# Patient Record
Sex: Male | Born: 1990 | Race: Black or African American | Hispanic: No | Marital: Single | Smoking: Current some day smoker
Health system: Southern US, Community
[De-identification: ages and names within clinical notes are randomized; demographics above are authoritative.]

## PROBLEM LIST (undated history)

## (undated) DIAGNOSIS — M419 Scoliosis, unspecified: Secondary | ICD-10-CM

---
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
## 2010-05-20 ENCOUNTER — Emergency Department (HOSPITAL_COMMUNITY): Admission: EM | Admit: 2010-05-20 | Discharge: 2010-05-20 | Payer: Self-pay | Admitting: Emergency Medicine

## 2011-01-27 ENCOUNTER — Emergency Department (HOSPITAL_COMMUNITY): Payer: Self-pay

## 2011-01-27 ENCOUNTER — Encounter (HOSPITAL_COMMUNITY): Payer: Self-pay | Admitting: Radiology

## 2011-01-27 ENCOUNTER — Emergency Department (HOSPITAL_COMMUNITY)
Admission: EM | Admit: 2011-01-27 | Discharge: 2011-01-27 | Disposition: A | Discharge status: Home / Self Care | Payer: Self-pay | Attending: Emergency Medicine | Admitting: Emergency Medicine

## 2011-01-27 DIAGNOSIS — S0993XA Unspecified injury of face, initial encounter: Secondary | ICD-10-CM | POA: Insufficient documentation

## 2011-01-27 DIAGNOSIS — W11XXXA Fall on and from ladder, initial encounter: Secondary | ICD-10-CM | POA: Insufficient documentation

## 2011-01-27 DIAGNOSIS — Y92009 Unspecified place in unspecified non-institutional (private) residence as the place of occurrence of the external cause: Secondary | ICD-10-CM | POA: Insufficient documentation

## 2011-01-27 DIAGNOSIS — M542 Cervicalgia: Secondary | ICD-10-CM | POA: Insufficient documentation

## 2011-01-27 DIAGNOSIS — S20229A Contusion of unspecified back wall of thorax, initial encounter: Secondary | ICD-10-CM | POA: Insufficient documentation

## 2011-01-27 DIAGNOSIS — M546 Pain in thoracic spine: Secondary | ICD-10-CM | POA: Insufficient documentation

## 2016-01-03 ENCOUNTER — Encounter (HOSPITAL_COMMUNITY): Payer: Self-pay | Admitting: Nurse Practitioner

## 2016-01-03 ENCOUNTER — Emergency Department (HOSPITAL_COMMUNITY): Payer: Self-pay

## 2016-01-03 ENCOUNTER — Emergency Department (HOSPITAL_COMMUNITY)
Admission: EM | Admit: 2016-01-03 | Discharge: 2016-01-03 | Disposition: A | Discharge status: Home / Self Care | Payer: Self-pay | Attending: Emergency Medicine | Admitting: Emergency Medicine

## 2016-01-03 DIAGNOSIS — R630 Anorexia: Secondary | ICD-10-CM | POA: Insufficient documentation

## 2016-01-03 DIAGNOSIS — R197 Diarrhea, unspecified: Secondary | ICD-10-CM | POA: Insufficient documentation

## 2016-01-03 DIAGNOSIS — N390 Urinary tract infection, site not specified: Secondary | ICD-10-CM | POA: Insufficient documentation

## 2016-01-03 DIAGNOSIS — R112 Nausea with vomiting, unspecified: Secondary | ICD-10-CM | POA: Insufficient documentation

## 2016-01-03 DIAGNOSIS — R05 Cough: Secondary | ICD-10-CM | POA: Insufficient documentation

## 2016-01-03 DIAGNOSIS — R079 Chest pain, unspecified: Secondary | ICD-10-CM | POA: Insufficient documentation

## 2016-01-03 DIAGNOSIS — F17210 Nicotine dependence, cigarettes, uncomplicated: Secondary | ICD-10-CM | POA: Insufficient documentation

## 2016-01-03 LAB — CBC
HCT: 45.6 % (ref 39.0–52.0)
Hemoglobin: 15.4 g/dL (ref 13.0–17.0)
MCH: 28.9 pg (ref 26.0–34.0)
MCHC: 33.8 g/dL (ref 30.0–36.0)
MCV: 85.6 fL (ref 78.0–100.0)
Platelets: 260 10*3/uL (ref 150–400)
RBC: 5.33 MIL/uL (ref 4.22–5.81)
RDW: 12.9 % (ref 11.5–15.5)
WBC: 6.5 10*3/uL (ref 4.0–10.5)

## 2016-01-03 LAB — COMPREHENSIVE METABOLIC PANEL
ALT: 19 U/L (ref 17–63)
AST: 22 U/L (ref 15–41)
Albumin: 3.5 g/dL (ref 3.5–5.0)
Alkaline Phosphatase: 57 U/L (ref 38–126)
Anion gap: 9 (ref 5–15)
BUN: 9 mg/dL (ref 6–20)
CO2: 26 mmol/L (ref 22–32)
Calcium: 9 mg/dL (ref 8.9–10.3)
Chloride: 104 mmol/L (ref 101–111)
Creatinine, Ser: 0.96 mg/dL (ref 0.61–1.24)
GFR calc Af Amer: >60 mL/min (ref >60)
GFR calc non Af Amer: >60 mL/min (ref >60)
Glucose, Bld: 98 mg/dL (ref 65–99)
Potassium: 3.8 mmol/L (ref 3.5–5.1)
Sodium: 139 mmol/L (ref 135–145)
Total Bilirubin: 0.9 mg/dL (ref 0.3–1.2)
Total Protein: 6.1 g/dL — ABNORMAL LOW (ref 6.5–8.1)

## 2016-01-03 LAB — URINE MICROSCOPIC-ADD ON

## 2016-01-03 LAB — URINALYSIS, ROUTINE W REFLEX MICROSCOPIC
Glucose, UA: NEGATIVE mg/dL
Hgb urine dipstick: NEGATIVE
Ketones, ur: 40 mg/dL — AB
Nitrite: NEGATIVE
Protein, ur: 30 mg/dL — AB
Specific Gravity, Urine: 1.03 (ref 1.005–1.030)
pH: 8 (ref 5.0–8.0)

## 2016-01-03 LAB — I-STAT TROPONIN, ED: Troponin i, poc: 0 ng/mL (ref 0.00–0.08)

## 2016-01-03 MED ORDER — SULFAMETHOXAZOLE-TRIMETHOPRIM 800-160 MG PO TABS: 1.0000 | ORAL_TABLET | Freq: Two times a day (BID) | ORAL | Status: AC

## 2016-01-03 MED ORDER — ONDANSETRON 4 MG PO TBDP: 4.0000 mg | ORAL_TABLET | Freq: Three times a day (TID) | ORAL | Status: DC | PRN

## 2016-01-03 MED ORDER — GI COCKTAIL ~~LOC~~
30.0000 mL | Freq: Once | ORAL | Status: AC
  Administered 2016-01-03: 30 mL via ORAL
  Filled 2016-01-03: qty 30

## 2016-01-03 MED ORDER — SODIUM CHLORIDE 0.9 % IV BOLUS (SEPSIS)
1000.0000 mL | Freq: Once | INTRAVENOUS | Status: AC
  Administered 2016-01-03: 1000 mL via INTRAVENOUS

## 2016-01-03 MED ORDER — ONDANSETRON HCL 4 MG/2ML IJ SOLN
4.0000 mg | Freq: Once | INTRAMUSCULAR | Status: AC
  Administered 2016-01-03: 4 mg via INTRAVENOUS
  Filled 2016-01-03: qty 2

## 2016-01-03 NOTE — ED Notes (Signed)
He c/o 3 day history n/v/d, sweats, abd pain, headaches, loss of appetite, unable to tolerate any oral intake. hes A&Ox4, breathing easily

## 2016-01-03 NOTE — ED Provider Notes (Signed)
CSN: [id number]     Arrival date & time 01/03/16  1541 History  By signing my name below, I, Placido SouLogan Joldersma, attest that this documentation has been prepared under the direction and in the presence of Amandine Covino Y Melia Hopes, [place]-C. Electronically Signed: Placido SouLogan Joldersma, ED Scribe. 01/03/2016. 8:16 PM.    Chief Complaint  Patient presents with  . Emesis   The history is provided by the patient. No language interpreter was used.    HPI Comments: Larry Tapia is a 25 y.o. male who presents to the Emergency Department complaining of n/v/d with onset 3 days ago. Pt notes associated diaphoresis, abd pain s/p eating, HA, increased urinary frequency for the past week, subjective fevers (98.7 F in triage), chills, loss of appetite. He  cough and sharp CP which has been present for some time and worsens with coughing. He describes his diarrhea as "watery" averaging 5x per day and vomiting 6x yesterday morning. Pt notes a hx of smoking averaging 3 cigarettes per day, 2x ETOH per week and marijuana daily. Pt notes being sexually active and confirms using protection. While he endorses urinary frequency, he denies dysuria, hematuria or any other associated symptoms at this time. Denies penile pain, discharge or new lesions.   History reviewed. No pertinent past medical history. History reviewed. No pertinent past surgical history. History reviewed. No pertinent family history. Social History  Substance Use Topics  . Smoking status: Current Every Day Smoker    Types: Cigarettes  . Smokeless tobacco: None  . Alcohol Use: No    Review of Systems  All other systems reviewed and are negative.   Allergies  Review of patient's allergies indicates no known allergies.  Home Medications   Prior to Admission medications   Not on File   BP 115/75 mmHg  Pulse 85  Temp(Src) 98.7 F (37.1 C) (Oral)  Resp 18  SpO2 98%    Physical Exam  Constitutional: He is oriented to person, place, and time. He appears  well-developed and well-nourished.  HENT:  Head: Normocephalic and atraumatic.  Nose: Nose normal.  Mouth/Throat: No oropharyngeal exudate.  Eyes: Conjunctivae and EOM are normal.  Neck: Normal range of motion. Neck supple.  Cardiovascular: Normal rate, regular rhythm and normal heart sounds.   Pulmonary/Chest: Effort normal and breath sounds normal. No respiratory distress. He has no wheezes. He has no rales.  Chest is diffusely TTP  Abdominal: Soft. Bowel sounds are normal. There is generalized tenderness. There is no rigidity, no rebound, no guarding and no CVA tenderness.  Musculoskeletal: Normal range of motion.  Lymphadenopathy:    He has no cervical adenopathy.  Neurological: He is alert and oriented to person, place, and time.  Skin: Skin is warm and dry. He is not diaphoretic.  Psychiatric: He has a normal mood and affect. His behavior is normal.  Nursing note and vitals reviewed.   ED Course  Procedures  DIAGNOSTIC STUDIES: Oxygen Saturation is 98% on RA, normal by my interpretation.    COORDINATION OF CARE: 8:11 PM Pt presents today with multiple complaints. Discussed next steps with pt and he agreed to the plan.   Labs Review Labs Reviewed  COMPREHENSIVE METABOLIC PANEL - Abnormal; Notable for the following:    Total Protein 6.1 (*)    All other components within normal limits  URINALYSIS, ROUTINE W REFLEX MICROSCOPIC (NOT AT [facility]) - Abnormal; Notable for the following:    Color, Urine AMBER (*)    Bilirubin Urine SMALL (*)  Ketones, ur 40 (*)    Protein, ur 30 (*)    Leukocytes, UA SMALL (*)    All other components within normal limits  URINE MICROSCOPIC-ADD ON - Abnormal; Notable for the following:    Squamous Epithelial / LPF 0-5 (*)    Bacteria, UA MANY (*)    All other components within normal limits  URINE CULTURE  CBC  I-STAT TROPOININ, ED  GC/CHLAMYDIA PROBE AMP ([place]) NOT AT [facility]    Imaging Review Dg Chest 2 View  01/03/2016  CLINICAL  DATA:  Acute onset of nausea, vomiting and diarrhea. Diaphoresis and generalized abdominal pain. Headache. Increased urinary frequency and subjective fevers. Loss of appetite, chills, cough and sharp chest pain. Initial encounter. EXAM: CHEST  2 VIEW COMPARISON:  Chest radiograph performed 02/25/2006 FINDINGS: The lungs are well-aerated and clear. There is no evidence of focal opacification, pleural effusion or pneumothorax. The heart is normal in size; the mediastinal contour is within normal limits. No acute osseous abnormalities are seen. IMPRESSION: No acute cardiopulmonary process seen. Electronically Signed   By: Roanna Raider M.D.   On: 01/03/2016 21:18   I have personally reviewed and evaluated these lab results and images as part of my medical decision-making.   EKG Interpretation   Date/Time:  Friday January 03 2016 20:29:10 EST Ventricular Rate:  79 PR Interval:  136 QRS Duration: 92 QT Interval:  358 QTC Calculation: 410 R Axis:   84 Text Interpretation:  Normal sinus rhythm Moderate voltage criteria for  LVH, may be normal variant Early repolarization Borderline ECG No old  tracing to compare Confirmed by KNAPP  MD-J, JON ([id number]) on 01/03/2016  8:40:49 PM      MDM   Final diagnoses:  Urinary tract infection without hematuria, site unspecified  Non-intractable vomiting with nausea, vomiting of unspecified type    Pt with n/v/d, abd pain, increased urinary frequency. He is afebrile. He has no white count. I suspect UTI. However pt is a young sexually active male. He denies penile discharge or GU pain so i have lower suspicion for urethritis. Pt is nontoxic appeaing, afebrile, not tachycarding, not hypotensive. He has only mild chills and body aches. I have low suspicion for prostatitis. I discussed with pt that we can send his urine for culture and gc/chlamydia testing. He does not want any other STI testing at this time. Given his symptoms I will give him rx for bactrim but  will hold on rocephin/azithromycin tx for now. He is in agreement with this plan  Pt also complains of longtime chest pain that is acutely worse recently with his URI and GI symptoms. I suspect musculoskeletal pain as it is reproducible with palpation, worse with coughing, and worse with deep breaths. However will get EKG, troponin, and CXR though I have very low suspicion for any kind of ACS or other acute cardiopulmonary pathology. He is not hypoxic and has no increased wob. HEART score 1. PERC 0.  EKG NSR with possible LVH. Otherwise unremarkable. CXR negatie. Trop negative. Pt tolerating PO. Will d/c with meds as above. Will give resource guide to establish PCP. ER return precautions given  I personally performed the services described in this documentation, which was scribed in my presence. The recorded information has been reviewed and is accurate.   Carlene Coria, PA-C 01/03/16 2303  Marily Memos, MD 01/06/16 [phone]

## 2016-01-03 NOTE — Discharge Instructions (Signed)
You were seen in the emergency room today for evaluation of nausea and vomiting. Your urine does show signs of possible infection. This could be contributing to your nausea/vomiting, or you could have a separate virus causing those symptoms. However, since you are also having increased urinary frequency I will give you a prescription for antibiotic for a urinary tract infection. Please follow up with your primary care provider to re-check your urine after you finish the antibiotic. I will also send your urine for culture and we will call you if any changes need to be made to your medications. I also gave you the contact information for urology for follow up if you continue to experience urinary symptoms.   You also complained of chest pain that is worse with coughing. Your EKG, chest x-ray, and bloodwork were normal. Your pain is most likely musculoskeletal. You may take tylenol or motrin as needed.   Take medications as prescribed. Return to the emergency room for worsening condition or new concerning symptoms. Follow up with your regular doctor. If you don't have a regular doctor use one of the numbers below to establish a primary care doctor.   Emergency Department Resource Guide 1) Find a Doctor and Pay Out of Pocket Although you won't have to find out who is covered by your insurance plan, it is a good idea to ask around and get recommendations. You will then need to call the office and see if the doctor you have chosen will accept you as a new patient and what types of options they offer for patients who are self-pay. Some doctors offer discounts or will set up payment plans for their patients who do not have insurance, but you will need to ask so you aren't surprised when you get to your appointment.  2) Contact Your Local Health Department Not all health departments have doctors that can see patients for sick visits, but many do, so it is worth a call to see if yours does. If you don't know where  your local health department is, you can check in your phone book. The CDC also has a tool to help you locate your state's health department, and many state websites also have listings of all of their local health departments.  3) Find a Walk-in Clinic If your illness is not likely to be very severe or complicated, you may want to try a walk in clinic. These are popping up all over the country in pharmacies, drugstores, and shopping centers. They're usually staffed by nurse practitioners or physician assistants that have been trained to treat common illnesses and complaints. They're usually fairly quick and inexpensive. However, if you have serious medical issues or chronic medical problems, these are probably not your best option.  No Primary Care Doctor: - Call Health Connect at  [phone] - they can help you locate a primary care doctor that  accepts your insurance, provides certain services, etc. - Physician Referral [phone]- [phone]  Emergency Department Resource Guide 1) Find a Doctor and Pay Out of Pocket Although you won't have to find out who is covered by your insurance plan, it is a good idea to ask around and get recommendations. You will then need to call the office and see if the doctor you have chosen will accept you as a new patient and what types of options they offer for patients who are self-pay. Some doctors offer discounts or will set up payment plans for their patients who do not have insurance, but  you will need to ask so you aren't surprised when you get to your appointment.  2) Contact Your Local Health Department Not all health departments have doctors that can see patients for sick visits, but many do, so it is worth a call to see if yours does. If you don't know where your local health department is, you can check in your phone book. The CDC also has a tool to help you locate your state's health department, and many state websites also have listings of all of their  local health departments.  3) Find a Walk-in Clinic If your illness is not likely to be very severe or complicated, you may want to try a walk in clinic. These are popping up all over the country in pharmacies, drugstores, and shopping centers. They're usually staffed by nurse practitioners or physician assistants that have been trained to treat common illnesses and complaints. They're usually fairly quick and inexpensive. However, if you have serious medical issues or chronic medical problems, these are probably not your best option.  No Primary Care Doctor: - Call Health Connect at  [phone] - they can help you locate a primary care doctor that  accepts your insurance, provides certain services, etc. - Physician Referral Service- [phone]  Chronic Pain Problems: Organization         Address  Phone   Notes  [facility]  [phone] Patients need to be referred by their primary care doctor.   Medication Assistance: Organization         Address  Phone   Notes  [facility] Medication [facility] [address] [place], [place] [address] [phone] --Must be a resident of [facility] -- Must have NO insurance coverage whatsoever (no Medicaid/ Medicare, etc.) -- The pt. MUST have a primary care doctor that directs their care regularly and follows them in the community   [facility]  [phone]   [employer]  [phone]    Agencies that provide inexpensive medical care: Organization         Address  Phone   Notes  [facility]  [phone]   [facility]    [phone]   [facility] [address] [place], [place] [address] [phone]   [facility] [address] [place] [place] [phone]   [facility]    [phone]   [facility]    [phone]   [facility]  [address], [place]  Phone:  [phone], Fax:  [phone] Hours of Operation:  9 am - 6 pm, M-F.  Also accepts Medicaid/Medicare and self-pay.  [facility]  [address], [place] Phone: [phone], Fax: [phone]. Hours of Operation:  8:30 am - 5:30 pm, M-F.  Also accepts Medicaid and self-pay.  [facility] [place] [address] [place] Phone: [phone]   [facility] [address] Natasha Bence [place], [place] [phone], Ext. [phone] Mondays & Thursdays: 7-9 AM.  First 15 patients are seen on a first come, first serve basis.    Medicaid-accepting [facility] Providers:  Organization         Address  Phone   Notes  [facility] [address], [place] [phone] Also accepts self-pay patients.  [facility] [address] [place], [place]  [address] [place]  [phone]   [facility] [address], [place] [phone]   [facility] [address], [place] [phone]   Renaye Rakers [address], [place]   [phone] Only accepts [place] patients after they have their name applied to their card.   Self-Pay (no insurance) in [facility]:  Organization         Address  Phone   Notes  Sickle Cell Patients, [facility] [address] [place], [place] [phone]   [facility] [address] [place], [place] [phone]   [facility] [place]  [address],  [phone]   [facility]/Dr. Osei-Bonsu  [address], [place] or [address], [place] [phone] Phone number for both [place] and [place] locations is the same.  [facility] [address] [place] [phone]   [facility] [address] [place] or [address]  Dr [phone]   [facility] [address], [place] [phone], phone; [phone], fax Sees patients 1st and 3rd Saturday of every month.  Must not qualify for public or private insurance (i.e. Medicaid, Medicare, [place] Health Choice, Veterans' Benefits)  Household income should be no more than 200% of the poverty level The clinic cannot treat you if you are pregnant or think you are pregnant  Sexually transmitted diseases are not treated at the clinic.

## 2016-01-05 LAB — URINE CULTURE: Culture: NO GROWTH

## 2016-02-25 ENCOUNTER — Emergency Department (HOSPITAL_COMMUNITY): Admission: EM | Admit: 2016-02-25 | Discharge: 2016-02-25 | Discharge status: Against Medical Advice | Payer: Self-pay

## 2016-02-25 NOTE — ED Notes (Signed)
Patient called with no answer.  Checked waiting room and triage area.  Patient called x 2.  Will check back.

## 2016-07-01 ENCOUNTER — Encounter (HOSPITAL_COMMUNITY): Payer: Self-pay

## 2016-07-01 ENCOUNTER — Emergency Department (HOSPITAL_COMMUNITY)
Admission: EM | Admit: 2016-07-01 | Discharge: 2016-07-01 | Disposition: A | Discharge status: Home / Self Care | Payer: Self-pay | Attending: Emergency Medicine | Admitting: Emergency Medicine

## 2016-07-01 DIAGNOSIS — F17210 Nicotine dependence, cigarettes, uncomplicated: Secondary | ICD-10-CM | POA: Insufficient documentation

## 2016-07-01 DIAGNOSIS — Y999 Unspecified external cause status: Secondary | ICD-10-CM | POA: Insufficient documentation

## 2016-07-01 DIAGNOSIS — Y9383 Activity, rough housing and horseplay: Secondary | ICD-10-CM | POA: Insufficient documentation

## 2016-07-01 DIAGNOSIS — M79644 Pain in right finger(s): Secondary | ICD-10-CM | POA: Insufficient documentation

## 2016-07-01 DIAGNOSIS — Y929 Unspecified place or not applicable: Secondary | ICD-10-CM | POA: Insufficient documentation

## 2016-07-01 DIAGNOSIS — W0110XA Fall on same level from slipping, tripping and stumbling with subsequent striking against unspecified object, initial encounter: Secondary | ICD-10-CM | POA: Insufficient documentation

## 2016-07-01 DIAGNOSIS — S0101XA Laceration without foreign body of scalp, initial encounter: Secondary | ICD-10-CM | POA: Insufficient documentation

## 2016-07-01 MED ORDER — IBUPROFEN 100 MG/5ML PO SUSP
600.0000 mg | Freq: Once | ORAL | Status: AC
  Administered 2016-07-01: 600 mg via ORAL
  Filled 2016-07-01: qty 30

## 2016-07-01 MED ORDER — ACETAMINOPHEN 500 MG PO TABS
1000.0000 mg | ORAL_TABLET | Freq: Once | ORAL | Status: DC
  Filled 2016-07-01: qty 2

## 2016-07-01 MED ORDER — ACETAMINOPHEN 80 MG PO CHEW
500.0000 mg | CHEWABLE_TABLET | Freq: Once | ORAL | Status: AC
  Administered 2016-07-01: 520 mg via ORAL
  Filled 2016-07-01: qty 7

## 2016-07-01 MED ORDER — IBUPROFEN 400 MG PO TABS
400.0000 mg | ORAL_TABLET | Freq: Once | ORAL | Status: DC
Start: 2016-07-01 — End: 2016-07-01
  Filled 2016-07-01: qty 1

## 2016-07-01 MED ORDER — NAPROXEN 500 MG PO TABS: 500.0000 mg | ORAL_TABLET | Freq: Two times a day (BID) | ORAL | Status: DC

## 2016-07-01 NOTE — Discharge Instructions (Signed)
Please read and follow all provided instructions.  Your diagnoses today include:  1. Scalp laceration, initial encounter    Tests performed today include:  Vital signs. See below for your results today.   Medications prescribed:   Take as prescribed   Home care instructions:  Follow any educational materials contained in this packet.  Follow-up instructions: Please follow-up with your primary care provider for further evaluation of symptoms and treatment   Return instructions:   Please return to the Emergency Department if you do not get better, if you get worse, or new symptoms OR  - Fever (temperature greater than 101.26F)  - Bleeding that does not stop with holding pressure to the area    -Severe pain (please note that you may be more sore the day after your accident)  - Chest Pain  - Difficulty breathing  - Severe nausea or vomiting  - Inability to tolerate food and liquids  - Passing out  - Skin becoming red around your wounds  - Change in mental status (confusion or lethargy)  - New numbness or weakness     Please return if you have any other emergent concerns.  Additional Information:  Your vital signs today were: BP 117/86 mmHg   Pulse 72   Temp(Src) 98.8 F (37.1 C) (Oral)   Resp 18   Ht 5\' 6"  (1.676 m)   Wt 58.968 kg   BMI 20.99 kg/m2   SpO2 100% If your blood pressure (BP) was elevated above 135/85 this visit, please have this repeated by your doctor within one month. ---------------

## 2016-07-01 NOTE — ED Provider Notes (Signed)
CSN: [id number]     Arrival date & time 07/01/16  1733 History  By signing my name below, I, Larry Tapia, attest that this documentation has been prepared under the direction and in the presence of Audry Piliyler Angla Delahunt PA-C.  Electronically Signed: Renetta ChalkBobby Tapia, ED Scribe. 07/01/2016. 8:08 PM    No chief complaint on file.  The history is provided by the patient. No language interpreter was used.   HPI Comments: Larry Tapia is a 25 y.o. male who presents to the Emergency Department complaining of an injury to his forehead approximately 4 hours ago. Pt was "horseplaying" with a friend when he tripped and fell; hitting his head on the cement. Pt denies any loss of consciousness but states that his forehead was bleeding a lot. Bleeding is currently controlled. Pt reports current 6/10 pain. Pt also complains of associated headache. Pt denies taking any medication for pain PTA. Pt denies dizziness, nausea, vomiting  Pt also states he attempted to catch his fall with his right hand. Pt states he extended his arm with a clenched fist and landed with weight on his knuckles. Pt currently complains of pain to his right 3rd digit.   History reviewed. No pertinent past medical history. History reviewed. No pertinent past surgical history. No family history on file. Social History  Substance Use Topics  . Smoking status: Current Every Day Smoker    Types: Cigarettes  . Smokeless tobacco: None  . Alcohol Use: No    Review of Systems  Gastrointestinal: Negative for nausea and vomiting.  Skin: Positive for wound (laceration to left forehead).  Neurological: Positive for headaches. Negative for dizziness.  All other systems reviewed and are negative.  Allergies  Review of patient's allergies indicates no known allergies.  Home Medications   Prior to Admission medications   Medication Sig Start Date End Date Taking? Authorizing Provider  ondansetron (ZOFRAN ODT) 4 MG disintegrating tablet Take 1 tablet (4  mg total) by mouth every 8 (eight) hours as needed for nausea or vomiting. 01/03/16   Ace GinsSerena Y Sam, PA-C   BP 117/86 mmHg  Pulse 72  Temp(Src) 98.8 F (37.1 C) (Oral)  Resp 18  Ht 5\' 6"  (1.676 m)  Wt 130 lb (58.968 kg)  BMI 20.99 kg/m2  SpO2 100%   Physical Exam  Constitutional: He is oriented to person, place, and time. He appears well-developed and well-nourished. No distress.  HENT:  Head: Normocephalic and atraumatic.  1cm laceration on left scalp, mild hematoma, bleeding controlled, no sings of infection  Eyes: EOM are normal.  Neck: Normal range of motion.  Cardiovascular: Normal rate and regular rhythm.   Pulmonary/Chest: Effort normal.  Abdominal: Soft.  Musculoskeletal: Normal range of motion.  Right 3rd digit without obvious signs of fracture or dislocation. ROM intact. Neurovascularly. Intact.  Neurological: He is alert and oriented to person, place, and time. He has normal strength. No cranial nerve deficit or sensory deficit.  Cranial Nerves:  II: Pupils equal, round, reactive to light III,IV, VI: ptosis not present, extra-ocular motions intact bilaterally  V,VII: smile symmetric, facial light touch sensation equal VIII: hearing grossly normal bilaterally  IX,X: midline uvula rise  XI: bilateral shoulder shrug equal and strong XII: midline tongue extension  Skin: Skin is warm and dry. He is not diaphoretic.  Psychiatric: He has a normal mood and affect. His behavior is normal. Judgment and thought content normal.  Nursing note and vitals reviewed.  ED Course  Procedures  DIAGNOSTIC STUDIES: Oxygen Saturation  is 100% on RA, normal by my interpretation.  COORDINATION OF CARE: 7:55 PM-Will order dermabond. Discussed treatment plan with pt at bedside and pt agreed to plan.   Labs Review Labs Reviewed - No data to display  Imaging Review No results found. I have personally reviewed and evaluated these images and lab results as part of my medical  decision-making.   EKG Interpretation None      MDM  I have reviewed the relevant previous healthcare records. I obtained HPI from historian.  ED Course:  Assessment: Pt is a 24yM who presents with 1cm scalp laceration s/p mechanical fall. No LOC. No N/V. No headaches. On exam, pt in NAD. Nontoxic/nonseptic appearing. VSS. Afebrile. CN evaluated and intact. Patient has no of the following indications for head CT based on [place] CT head rule: GCS<15 two hours after injury, suspected open or depressed skull fracture, signs of basilar skull fracture (raccoon eyes, Battle's sign, otorrhea/rhinorrhea c/w CSF leak), 2+ episodes of vomiting, age > 44, amnesia before impact of > 30 minutes, severe mechanism. Repaired laceration with Dermabond. Plan is to DC home with follow up to PCp. At time of discharge, Patient is in no acute distress. Vital Signs are stable. Patient is able to ambulate. Patient able to tolerate PO.   Disposition/Plan:  DC Home Additional Verbal discharge instructions given and discussed with patient.  Pt Instructed to f/u with PCP in the next week for evaluation and treatment of symptoms. Return precautions given Pt acknowledges and agrees with plan  Supervising Physician Lyndal Pulley, MD   Final diagnoses:  Scalp laceration, initial encounter   I personally performed the services described in this documentation, which was scribed in my presence. The recorded information has been reviewed and is accurate.   Audry Pili, PA-C 07/01/16 2035  Lyndal Pulley, MD 07/02/16 [phone]

## 2016-07-01 NOTE — ED Notes (Signed)
Pt sts unable to swallow pills.  Spoke to PA, will order liquid

## 2016-07-01 NOTE — ED Notes (Signed)
Patient here with laceration to left forehead after tripping and falling down step hitting head on cement bottom, no loc, no bleeding on arrival. Alert and oriented. NAD

## 2016-07-01 NOTE — ED Notes (Signed)
Patient able to ambulate independently  

## 2017-01-08 ENCOUNTER — Emergency Department (HOSPITAL_COMMUNITY)
Admission: EM | Admit: 2017-01-08 | Discharge: 2017-01-08 | Disposition: A | Discharge status: Home / Self Care | Payer: Self-pay | Attending: Physician Assistant | Admitting: Physician Assistant

## 2017-01-08 ENCOUNTER — Encounter (HOSPITAL_COMMUNITY): Payer: Self-pay | Admitting: *Deleted

## 2017-01-08 ENCOUNTER — Emergency Department (HOSPITAL_COMMUNITY): Payer: Self-pay

## 2017-01-08 DIAGNOSIS — R0789 Other chest pain: Secondary | ICD-10-CM

## 2017-01-08 DIAGNOSIS — F17210 Nicotine dependence, cigarettes, uncomplicated: Secondary | ICD-10-CM | POA: Insufficient documentation

## 2017-01-08 DIAGNOSIS — R079 Chest pain, unspecified: Secondary | ICD-10-CM

## 2017-01-08 LAB — BASIC METABOLIC PANEL
ANION GAP: 10 (ref 5–15)
BUN: 8 mg/dL (ref 6–20)
CALCIUM: 9.6 mg/dL (ref 8.9–10.3)
CHLORIDE: 105 mmol/L (ref 101–111)
CO2: 24 mmol/L (ref 22–32)
CREATININE: 0.86 mg/dL (ref 0.61–1.24)
GFR calc non Af Amer: >60 mL/min (ref >60)
Glucose, Bld: 91 mg/dL (ref 65–99)
Potassium: 3.6 mmol/L (ref 3.5–5.1)
Sodium: 139 mmol/L (ref 135–145)

## 2017-01-08 LAB — CBC
HCT: 42.7 % (ref 39.0–52.0)
Hemoglobin: 14.1 g/dL (ref 13.0–17.0)
MCH: 28 pg (ref 26.0–34.0)
MCHC: 33 g/dL (ref 30.0–36.0)
MCV: 84.9 fL (ref 78.0–100.0)
PLATELETS: 249 10*3/uL (ref 150–400)
RBC: 5.03 MIL/uL (ref 4.22–5.81)
RDW: 13.8 % (ref 11.5–15.5)
WBC: 5 10*3/uL (ref 4.0–10.5)

## 2017-01-08 LAB — I-STAT TROPONIN, ED: TROPONIN I, POC: 0 ng/mL (ref 0.00–0.08)

## 2017-01-08 MED ORDER — IBUPROFEN 800 MG PO TABS: 800.0000 mg | ORAL_TABLET | Freq: Three times a day (TID) | ORAL | 0 refills | Status: DC | PRN

## 2017-01-08 NOTE — ED Notes (Signed)
Patient undressed, in gown, on monitor, continuous pulse oximetry and blood pressure cuff; visitor at bedside 

## 2017-01-08 NOTE — ED Triage Notes (Signed)
Pt in from home c/o L CP with cough, denies n/v/d, pt denies SOB, A&O x4

## 2017-01-08 NOTE — ED Notes (Signed)
Patient complains of recurrent throbbing chest pain that started approximately eight months ago.

## 2017-01-08 NOTE — Discharge Instructions (Signed)
Follow-up with the clinic provided.  Return here as needed. °

## 2017-01-08 NOTE — ED Provider Notes (Signed)
[facility] Provider Note   CSN: [id number] Arrival date & time: 01/08/17  [id number]     History   Chief Complaint Chief Complaint  Patient presents with  . Chest Pain    HPI Larry Tapia is a 26 y.o. male.  HPI Patient presents to the emergency department with intermittent chest pain over the last few months, but worse in the last 2 days.  The patient states that the pain is sharp and can last from seconds to minutes.  He states that nothing seems make the condition better or worse.  Patient states that nothing alleviates the symptomsThe patient denies  shortness of breath, headache,blurred vision, neck pain, fever, cough, weakness, numbness, dizziness, anorexia, edema, abdominal pain, nausea, vomiting, diarrhea, rash, back pain, dysuria, hematemesis, bloody stool, near syncope, or syncope. History reviewed. No pertinent past medical history.  There are no active problems to display for this patient.   History reviewed. No pertinent surgical history.     Home Medications    Prior to Admission medications   Medication Sig Start Date End Date Taking? Authorizing Provider  naproxen (NAPROSYN) 500 MG tablet Take 1 tablet (500 mg total) by mouth 2 (two) times daily. 07/01/16   Audry Piliyler Mohr, PA-C  ondansetron (ZOFRAN ODT) 4 MG disintegrating tablet Take 1 tablet (4 mg total) by mouth every 8 (eight) hours as needed for nausea or vomiting. 01/03/16   Carlene CoriaSerena Y Sam, PA-C    Family History No family history on file.  Social History Social History  Substance Use Topics  . Smoking status: Current Every Day Smoker    Types: Cigarettes  . Smokeless tobacco: Never Used  . Alcohol use No     Allergies   Patient has no known allergies.   Review of Systems Review of Systems All other systems negative except as documented in the HPI. All pertinent positives and negatives as reviewed in the HPI.  Physical Exam Updated Vital Signs BP 117/80 (BP Location: Right Arm)    Pulse 86   Temp 98.4 F (36.9 C) (Oral)   Resp 18   Ht 5\' 6"  (1.676 m)   Wt 59 kg   SpO2 100%   BMI 20.98 kg/m   Physical Exam  Constitutional: He is oriented to person, place, and time. He appears well-developed and well-nourished. No distress.  HENT:  Head: Normocephalic and atraumatic.  Mouth/Throat: Oropharynx is clear and moist.  Eyes: Pupils are equal, round, and reactive to light.  Neck: Normal range of motion. Neck supple.  Cardiovascular: Normal rate, regular rhythm and normal heart sounds.  Exam reveals no gallop and no friction rub.   No murmur heard. Pulmonary/Chest: Effort normal and breath sounds normal. No respiratory distress. He has no wheezes.  Abdominal: Soft. Bowel sounds are normal. He exhibits no distension. There is no tenderness.  Neurological: He is alert and oriented to person, place, and time. He exhibits normal muscle tone. Coordination normal.  Skin: Skin is warm and dry. No rash noted. No erythema.  Psychiatric: He has a normal mood and affect. His behavior is normal.  Nursing note and vitals reviewed.    ED Treatments / Results  Labs (all labs ordered are listed, but only abnormal results are displayed) Labs Reviewed  BASIC METABOLIC PANEL  CBC  I-STAT TROPOININ, ED    EKG  EKG Interpretation  Date/Time:  Friday January 08 2017 09:33:35 EST Ventricular Rate:  72 PR Interval:  146 QRS Duration: 92 QT Interval:  360 QTC Calculation:  394 R Axis:   -27 Text Interpretation:  Normal sinus rhythm Left ventricular hypertrophy ST elevation, consider early repolarization, pericarditis, or injury Abnormal ECG very similar to prior EKG one year ago.  Confirmed by Kandis Mannan ([id number]) on 01/08/2017 1:44:38 PM       Radiology Dg Chest 2 View  Result Date: 01/08/2017 CLINICAL DATA:  Chest pain for 2 weeks EXAM: CHEST  2 VIEW COMPARISON:  January 03, 2016. FINDINGS: Lungs are clear. Heart size and pulmonary vascularity are normal. No  adenopathy. No pneumothorax. No bone lesions. There are overlying monitor leads bilaterally. IMPRESSION: No edema or consolidation. Electronically Signed   By: Bretta Bang III M.D.   On: 01/08/2017 10:40    Procedures Procedures (including critical care time)  Medications Ordered in ED Medications - No data to display   Initial Impression / Assessment and Plan / ED Course  I have reviewed the triage vital signs and the nursing notes.  Pertinent labs & imaging results that were available during my care of the patient were reviewed by me and considered in my medical decision making (see chart for details).     Is having atypical chest pain symptoms.  They have been ongoing for quite a while, but we will have him follow-up with cardiology for further evaluation.  Patient is advised plan and all questions were answered.  Patient has been otherwise stable.  Patient is advised to return for any worsening in his condition  Final Clinical Impressions(s) / ED Diagnoses   Final diagnoses:  None    New Prescriptions New Prescriptions   No medications on file     Charlestine Night, PA-C 01/08/17 1423    Courteney Lyn Mackuen, MD 01/08/17 1424

## 2017-08-30 ENCOUNTER — Encounter (HOSPITAL_COMMUNITY): Payer: Self-pay | Admitting: Emergency Medicine

## 2017-08-30 ENCOUNTER — Emergency Department (HOSPITAL_COMMUNITY)
Admission: EM | Admit: 2017-08-30 | Discharge: 2017-08-30 | Disposition: A | Discharge status: Home / Self Care | Payer: No Typology Code available for payment source | Attending: Emergency Medicine | Admitting: Emergency Medicine

## 2017-08-30 DIAGNOSIS — Y939 Activity, unspecified: Secondary | ICD-10-CM | POA: Diagnosis not present

## 2017-08-30 DIAGNOSIS — M791 Myalgia, unspecified site: Secondary | ICD-10-CM

## 2017-08-30 DIAGNOSIS — Y998 Other external cause status: Secondary | ICD-10-CM | POA: Diagnosis not present

## 2017-08-30 DIAGNOSIS — Y92410 Unspecified street and highway as the place of occurrence of the external cause: Secondary | ICD-10-CM | POA: Diagnosis not present

## 2017-08-30 DIAGNOSIS — F17210 Nicotine dependence, cigarettes, uncomplicated: Secondary | ICD-10-CM | POA: Diagnosis not present

## 2017-08-30 MED ORDER — NAPROXEN 250 MG PO TABS
500.0000 mg | ORAL_TABLET | Freq: Once | ORAL | Status: AC
  Administered 2017-08-30: 500 mg via ORAL
  Filled 2017-08-30: qty 2

## 2017-08-30 MED ORDER — METHOCARBAMOL 500 MG PO TABS: 500.0000 mg | ORAL_TABLET | Freq: Two times a day (BID) | ORAL | 0 refills | Status: DC

## 2017-08-30 MED ORDER — NAPROXEN 500 MG PO TABS: 500.0000 mg | ORAL_TABLET | Freq: Two times a day (BID) | ORAL | 0 refills | Status: DC

## 2017-08-30 MED ORDER — METHOCARBAMOL 500 MG PO TABS
500.0000 mg | ORAL_TABLET | Freq: Once | ORAL | Status: AC
  Administered 2017-08-30: 500 mg via ORAL
  Filled 2017-08-30: qty 1
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
## 2017-08-30 NOTE — Discharge Instructions (Signed)
Take the prescribed medication as directed.  You may continue to be sore for a few days. Follow-up with your primary care doctor if any ongoing issues. Return to the ED for new or worsening symptoms.

## 2017-08-30 NOTE — ED Notes (Signed)
PT states understanding of care given, follow up care, and medication prescribed. PT ambulated from ED to car with a steady gait. 
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
## 2017-09-13 ENCOUNTER — Emergency Department (HOSPITAL_COMMUNITY)
Admission: EM | Admit: 2017-09-13 | Discharge: 2017-09-13 | Disposition: A | Discharge status: Home / Self Care | Payer: Self-pay | Attending: Emergency Medicine | Admitting: Emergency Medicine

## 2017-09-13 ENCOUNTER — Encounter (HOSPITAL_COMMUNITY): Payer: Self-pay

## 2017-09-13 DIAGNOSIS — F17210 Nicotine dependence, cigarettes, uncomplicated: Secondary | ICD-10-CM | POA: Insufficient documentation

## 2017-09-13 DIAGNOSIS — K047 Periapical abscess without sinus: Secondary | ICD-10-CM | POA: Insufficient documentation

## 2017-09-13 MED ORDER — IBUPROFEN 600 MG PO TABS: 600.0000 mg | ORAL_TABLET | Freq: Four times a day (QID) | ORAL | 0 refills | Status: DC | PRN

## 2017-09-13 MED ORDER — PENICILLIN V POTASSIUM 500 MG PO TABS: 500.0000 mg | ORAL_TABLET | Freq: Four times a day (QID) | ORAL | 0 refills | Status: AC

## 2017-09-13 NOTE — ED Triage Notes (Signed)
Pt presents with c/o jaw pain and swelling since last Thursday.

## 2017-09-13 NOTE — Discharge Instructions (Signed)
You have a dental infection. It is very important that you get evaluated by a dentist as soon as possible. Call tomorrow to schedule an appointment. Ibuprofen as needed for pain. Take your full course of antibiotics. Read the instructions below. ° °Eat a soft or liquid diet and rinse your mouth out after meals with warm water. You should see a dentist or return here at once if you have increased swelling, increased pain or uncontrolled bleeding from the site of your injury. ° °SEEK MEDICAL CARE IF:  °You have increased pain not controlled with medicines.  °You have swelling around your tooth, in your face or neck.  °You have bleeding which starts, continues, or gets worse.  °You have a fever >101 °If you are unable to open your mouth °

## 2017-09-13 NOTE — ED Provider Notes (Signed)
[facility] Provider Note   CSN: [id number] Arrival date & time: 09/13/17  1418     History   Chief Complaint Chief Complaint  Patient presents with  . Oral Swelling    HPI STRIDER Larry Tapia is a 26 y.o. male.  The history is provided by the patient and medical records. No language interpreter was used.    Larry Tapia is a 26 y.o. male who presents to the Emergency Department complaining of persistent, gradually worsening, left-sided, lower dental pain beginning yesterday. Pt describes their pain as throbbing. Pt has been taking Tylenol at home with moderate relief of pain. Pain is exacerbated by chewing. They are not currently followed by dentistry. Associated symptoms include swelling to the area. Pt denies fever, chills, difficulty breathing, difficulty swallowing.   History reviewed. No pertinent past medical history.  There are no active problems to display for this patient.   History reviewed. No pertinent surgical history.     Home Medications    Prior to Admission medications   Medication Sig Start Date End Date Taking? Authorizing Provider  ibuprofen (ADVIL,MOTRIN) 600 MG tablet Take 1 tablet (600 mg total) by mouth every 6 (six) hours as needed for moderate pain. 09/13/17   Chantee Cerino, Chase Picket, PA-C  penicillin v potassium (VEETID) 500 MG tablet Take 1 tablet (500 mg total) by mouth 4 (four) times daily. 09/13/17 09/20/17  Venera Privott, Chase Picket, PA-C    Family History No family history on file.  Social History Social History  Substance Use Topics  . Smoking status: Current Every Day Smoker    Packs/day: 0.50    Types: Cigarettes  . Smokeless tobacco: Never Used  . Alcohol use No     Allergies   Patient has no known allergies.   Review of Systems Review of Systems  Constitutional: Negative for chills and fever.  HENT: Positive for dental problem and facial swelling. Negative for congestion, sore throat and trouble swallowing.     Respiratory: Negative for cough and shortness of breath.      Physical Exam Updated Vital Signs BP 114/67 (BP Location: Right Arm)   Pulse 76   Temp 98.3 F (36.8 C) (Oral)   Resp 16   Ht  (1.676 m)   Wt 59 kg (130 lb)   SpO2 100%   BMI 20.98 kg/m   Physical Exam  Constitutional: He appears well-developed and well-nourished. No distress.  HENT:  Head: Normocephalic and atraumatic.  Mouth/Throat:    Dental cavities and poor oral dentition noted. Pain along tooth as depicted in image with associated swelling.  No sublingual or submandibular tenderness. Midline uvula. No trismus. OP moist and clear. No oropharyngeal erythema or edema. Neck supple with no tenderness.  Neck: Neck supple.  Cardiovascular: Normal rate, regular rhythm and normal heart sounds.   No murmur heard. Pulmonary/Chest: Effort normal and breath sounds normal. No respiratory distress. He has no wheezes. He has no rales.  Musculoskeletal: Normal range of motion.  Neurological: He is alert.  Skin: Skin is warm and dry.  Nursing note and vitals reviewed.    ED Treatments / Results  Labs (all labs ordered are listed, but only abnormal results are displayed) Labs Reviewed - No data to display  EKG  EKG Interpretation None       Radiology No results found.  Procedures Procedures (including critical care time)  Medications Ordered in ED Medications - No data to display   Initial Impression / Assessment and Plan /  ED Course  I have reviewed the triage vital signs and the nursing notes.  Pertinent labs & imaging results that were available during my care of the patient were reviewed by me and considered in my medical decision making (see chart for details).    Larry Tapia is a 26 y.o. male who presents to ED for dental pain. No abscess requiring immediate incision and drainage. Patient is afebrile, non toxic appearing, and swallowing secretions well. Exam not concerning for Ludwig's  angina or pharyngeal abscess. Will treat with PenVK. I provided dental resource guide and stressed the importance of dental follow up for ultimate management of dental pain. Patient voices understanding and is agreeable to plan.  Final Clinical Impressions(s) / ED Diagnoses   Final diagnoses:  Dental infection    New Prescriptions New Prescriptions   IBUPROFEN (ADVIL,MOTRIN) 600 MG TABLET    Take 1 tablet (600 mg total) by mouth every 6 (six) hours as needed for moderate pain.   PENICILLIN V POTASSIUM (VEETID) 500 MG TABLET    Take 1 tablet (500 mg total) by mouth 4 (four) times daily.     Lulubelle Simcoe, Chase Picket, PA-C 09/13/17 1741    Pricilla Loveless, MD 09/14/17 [phone]

## 2017-09-16 ENCOUNTER — Encounter (HOSPITAL_COMMUNITY): Payer: Self-pay | Admitting: Emergency Medicine

## 2017-09-16 ENCOUNTER — Emergency Department (HOSPITAL_COMMUNITY): Payer: Self-pay

## 2017-09-16 ENCOUNTER — Emergency Department (HOSPITAL_COMMUNITY)
Admission: EM | Admit: 2017-09-16 | Discharge: 2017-09-16 | Disposition: A | Discharge status: Home / Self Care | Payer: Self-pay | Attending: Emergency Medicine | Admitting: Emergency Medicine

## 2017-09-16 DIAGNOSIS — R55 Syncope and collapse: Secondary | ICD-10-CM | POA: Insufficient documentation

## 2017-09-16 DIAGNOSIS — R0602 Shortness of breath: Secondary | ICD-10-CM | POA: Insufficient documentation

## 2017-09-16 DIAGNOSIS — F17210 Nicotine dependence, cigarettes, uncomplicated: Secondary | ICD-10-CM | POA: Insufficient documentation

## 2017-09-16 LAB — I-STAT TROPONIN, ED: Troponin i, poc: 0 ng/mL (ref 0.00–0.08)

## 2017-09-16 LAB — I-STAT CHEM 8, ED
BUN: 18 mg/dL (ref 6–20)
CHLORIDE: 104 mmol/L (ref 101–111)
CREATININE: 0.8 mg/dL (ref 0.61–1.24)
Calcium, Ion: 1.18 mmol/L (ref 1.15–1.40)
Glucose, Bld: 73 mg/dL (ref 65–99)
HCT: 41 % (ref 39.0–52.0)
Hemoglobin: 13.9 g/dL (ref 13.0–17.0)
Potassium: 3.4 mmol/L — ABNORMAL LOW (ref 3.5–5.1)
SODIUM: 141 mmol/L (ref 135–145)
TCO2: 24 mmol/L (ref 22–32)

## 2017-09-16 NOTE — Discharge Instructions (Signed)
To find a primary care or specialty doctor please call [phone] or [phone] to access "[facility] Find a Doctor Service." ° °You may also go on the [facility] website at [email or web address] ° °There are also multiple [facility], [facility], [facility] and [facility] practices throughout the [place] that are frequently accepting new patients. You may find a clinic that is close to your home and contact them. ° °[facility] -  °[address] °[place] [address] °[phone] ° ° °[facility] -  °[address] °[place] [address] °[phone] ° ° °[facility] - °[address]  °[place] [address] °[phone] ° ° °
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
## 2017-09-16 NOTE — ED Notes (Signed)
ED Provider at bedside. 

## 2018-06-02 ENCOUNTER — Emergency Department (HOSPITAL_COMMUNITY)
Admission: EM | Admit: 2018-06-02 | Discharge: 2018-06-02 | Disposition: A | Discharge status: Home / Self Care | Payer: Self-pay | Attending: Emergency Medicine | Admitting: Emergency Medicine

## 2018-06-02 ENCOUNTER — Emergency Department (HOSPITAL_COMMUNITY): Payer: Self-pay

## 2018-06-02 ENCOUNTER — Other Ambulatory Visit: Payer: Self-pay

## 2018-06-02 ENCOUNTER — Encounter (HOSPITAL_COMMUNITY): Payer: Self-pay | Admitting: *Deleted

## 2018-06-02 DIAGNOSIS — R079 Chest pain, unspecified: Secondary | ICD-10-CM | POA: Insufficient documentation

## 2018-06-02 DIAGNOSIS — R05 Cough: Secondary | ICD-10-CM | POA: Insufficient documentation

## 2018-06-02 DIAGNOSIS — Z5321 Procedure and treatment not carried out due to patient leaving prior to being seen by health care provider: Secondary | ICD-10-CM | POA: Insufficient documentation

## 2018-06-02 LAB — BASIC METABOLIC PANEL
Anion gap: 13 (ref 5–15)
BUN: 13 mg/dL (ref 6–20)
CHLORIDE: 104 mmol/L (ref 101–111)
CO2: 23 mmol/L (ref 22–32)
CREATININE: 1.09 mg/dL (ref 0.61–1.24)
Calcium: 9.7 mg/dL (ref 8.9–10.3)
GFR calc Af Amer: >60 mL/min (ref >60)
GFR calc non Af Amer: >60 mL/min (ref >60)
GLUCOSE: 112 mg/dL — AB (ref 65–99)
Potassium: 3.2 mmol/L — ABNORMAL LOW (ref 3.5–5.1)
Sodium: 140 mmol/L (ref 135–145)

## 2018-06-02 LAB — CBC
HCT: 45 % (ref 39.0–52.0)
HEMOGLOBIN: 14.7 g/dL (ref 13.0–17.0)
MCH: 28.5 pg (ref 26.0–34.0)
MCHC: 32.7 g/dL (ref 30.0–36.0)
MCV: 87.2 fL (ref 78.0–100.0)
Platelets: 219 10*3/uL (ref 150–400)
RBC: 5.16 MIL/uL (ref 4.22–5.81)
RDW: 12.8 % (ref 11.5–15.5)
WBC: 5.2 10*3/uL (ref 4.0–10.5)

## 2018-06-02 LAB — I-STAT TROPONIN, ED: Troponin i, poc: 0 ng/mL (ref 0.00–0.08)
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
## 2018-06-02 NOTE — ED Notes (Signed)
Called for room X2 with no answer 

## 2018-11-12 ENCOUNTER — Emergency Department (HOSPITAL_COMMUNITY): Payer: Self-pay

## 2018-11-12 ENCOUNTER — Emergency Department (HOSPITAL_COMMUNITY)
Admission: EM | Admit: 2018-11-12 | Discharge: 2018-11-12 | Disposition: A | Discharge status: Home / Self Care | Payer: Self-pay | Attending: Emergency Medicine | Admitting: Emergency Medicine

## 2018-11-12 ENCOUNTER — Encounter (HOSPITAL_COMMUNITY): Payer: Self-pay | Admitting: Emergency Medicine

## 2018-11-12 DIAGNOSIS — F17210 Nicotine dependence, cigarettes, uncomplicated: Secondary | ICD-10-CM | POA: Insufficient documentation

## 2018-11-12 DIAGNOSIS — R0789 Other chest pain: Secondary | ICD-10-CM | POA: Insufficient documentation

## 2018-11-12 HISTORY — DX: Scoliosis, unspecified: M41.9

## 2018-11-12 LAB — CBC WITH DIFFERENTIAL/PLATELET
Abs Immature Granulocytes: 0.01 10*3/uL (ref 0.00–0.07)
BASOS PCT: 1 %
Basophils Absolute: 0 10*3/uL (ref 0.0–0.1)
EOS ABS: 0 10*3/uL (ref 0.0–0.5)
EOS PCT: 0 %
HEMATOCRIT: 44.6 % (ref 39.0–52.0)
Hemoglobin: 14.4 g/dL (ref 13.0–17.0)
Immature Granulocytes: 0 %
LYMPHS ABS: 0.6 10*3/uL — AB (ref 0.7–4.0)
LYMPHS PCT: 14 %
MCH: 28.2 pg (ref 26.0–34.0)
MCHC: 32.3 g/dL (ref 30.0–36.0)
MCV: 87.5 fL (ref 80.0–100.0)
MONOS PCT: 7 %
Monocytes Absolute: 0.3 10*3/uL (ref 0.1–1.0)
Neutro Abs: 3.7 10*3/uL (ref 1.7–7.7)
Neutrophils Relative %: 78 %
PLATELETS: 213 10*3/uL (ref 150–400)
RBC: 5.1 MIL/uL (ref 4.22–5.81)
RDW: 12.6 % (ref 11.5–15.5)
WBC: 4.7 10*3/uL (ref 4.0–10.5)
nRBC: 0 % (ref 0.0–0.2)

## 2018-11-12 LAB — BASIC METABOLIC PANEL
Anion gap: 6 (ref 5–15)
BUN: 8 mg/dL (ref 6–20)
CALCIUM: 9.4 mg/dL (ref 8.9–10.3)
CHLORIDE: 108 mmol/L (ref 98–111)
CO2: 28 mmol/L (ref 22–32)
CREATININE: 0.91 mg/dL (ref 0.61–1.24)
GFR calc non Af Amer: >60 mL/min (ref >60)
Glucose, Bld: 93 mg/dL (ref 70–99)
POTASSIUM: 4.1 mmol/L (ref 3.5–5.1)
Sodium: 142 mmol/L (ref 135–145)

## 2018-11-12 LAB — I-STAT TROPONIN, ED: Troponin i, poc: 0 ng/mL (ref 0.00–0.08)

## 2018-11-12 MED ORDER — IBUPROFEN 600 MG PO TABS: 600.0000 mg | ORAL_TABLET | Freq: Four times a day (QID) | ORAL | 0 refills | Status: DC | PRN

## 2018-11-12 MED ORDER — ACETAMINOPHEN 325 MG PO TABS
650.0000 mg | ORAL_TABLET | Freq: Once | ORAL | Status: AC
  Administered 2018-11-12: 650 mg via ORAL
  Filled 2018-11-12: qty 2

## 2018-11-12 MED ORDER — METHOCARBAMOL 500 MG PO TABS
500.0000 mg | ORAL_TABLET | Freq: Once | ORAL | Status: AC
  Administered 2018-11-12: 500 mg via ORAL
  Filled 2018-11-12: qty 1

## 2018-11-12 MED ORDER — METHOCARBAMOL 500 MG PO TABS: 500.0000 mg | ORAL_TABLET | Freq: Two times a day (BID) | ORAL | 0 refills | Status: AC

## 2018-11-12 NOTE — Discharge Instructions (Addendum)
Read instructions below for reasons to return to the Emergency Department. It is recommended that your follow up with your Primary Care Doctor in regards to today's visit. If you do not have a doctor, use the resource guide listed below to help you find one.   Muscle relaxants:  These medications can help with muscle tightness that is a cause of lower back pain.  Most of these medications can cause drowsiness, and it is not safe to drive or use dangerous machinery while taking them. They are primarily helpful when taken at night before sleep.   Tests performed today include: An EKG of your heart A chest x-ray Cardiac enzymes - a blood test for heart muscle damage Blood counts and electrolytes Vital signs. See below for your results today.   Chest Pain (Nonspecific)  HOME CARE INSTRUCTIONS  For the next few days, avoid physical activities that bring on chest pain. Continue physical activities as directed.  Do not smoke cigarettes or drink alcohol until your symptoms are gone. If you do smoke, it is time to quit. You may receive instructions and counseling on how to stop smoking. Only take over-the-counter or prescription medicine for pain, discomfort, or fever as directed by your caregiver.  Follow your caregiver's suggestions for further testing if your chest pain does not go away.  Keep any follow-up appointments you made. If you do not go to an appointment, you could develop lasting (chronic) problems with pain. If there is any problem keeping an appointment, you must call to reschedule.  SEEK MEDICAL CARE IF:  You think you are having problems from the medicine you are taking. Read your medicine instructions carefully.  Your chest pain does not go away, even after treatment.  You develop a rash with blisters on your chest.  SEEK IMMEDIATE MEDICAL CARE IF:  You have increased chest pain or pain that spreads to your arm, neck, jaw, back, or belly (abdomen).  You develop shortness of breath,  an increasing cough, or you are coughing up blood.  You have severe back or abdominal pain, feel sick to your stomach (nauseous) or throw up (vomit).  You develop severe weakness, fainting, or chills.  You have an oral temperature above 102 F (38.9 C), not controlled by medicine.  THIS IS AN EMERGENCY. Do not wait to see if the pain will go away. Get medical help at once. Call your local emergency services (911 in [place] Do not drive yourself to the hospital. Additional Information:  Your vital signs today were: BP 101/78    Pulse 70    Temp 98.5 F (36.9 C)    Resp 15    SpO2 99%  If your blood pressure (BP) was elevated above 135/85 this visit, please have this repeated by your doctor within one month. ---------------
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
## 2018-11-12 NOTE — ED Provider Notes (Signed)
[facility] EMERGENCY DEPARTMENT Provider Note   CSN: [id number] Arrival date & time: 11/12/18  1641     History   Chief Complaint Chief Complaint  Patient presents with  . Chest Pain    HPI Larry Tapia is a 27 y.o. male with no significant past medical history presents emergency department today for right-sided chest pain.  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  He notes over the last 2 days he has been having intermittent pain in his chest.  He reports that he went to smoke a cigarette she has 2 days ago and when he lifted the cigarette to his mouth he noticed pain just lateral to his substernal chest on the right side.  Pain does not radiate to his back, jaw, neck, either shoulder, or to his abdomen.  He reports the pain was dull, achy in nature and continued for several minutes but did not last longer than 5 minutes.  He reports that the pain went away with rest.  He denies any associated shortness of breath, diaphoresis, nausea, vomiting.  He reports that it is occurred several times since then and is always nonexertional.  He reports he came in today because the pain occurred again at 3 PM and has continued/been constant.  Patient reports that his pain does hurt worse with deep inspirations. However patient denies risk factors for PE including exogenous testosterone/hormone use, recent surgery or travel, trauma, immobilization, previous blood clot, hemoptysis, cancer, lower extremity pain or swelling. The patient does report he smokes tobacco and marijuana.  He denies any fever, IV drug use.  He denies cocaine use or other illicit drug use besides marijuana.  Patient denies family history or personal history of CAD.   HPI  Past Medical History:  Diagnosis Date  . Scoliosis     There are no active problems to display for this patient.   History reviewed. No pertinent surgical  history.      Home Medications    Prior to Admission medications   Medication Sig Start Date End Date Taking? Authorizing Provider  ibuprofen (ADVIL,MOTRIN) 600 MG tablet Take 1 tablet (600 mg total) by mouth every 6 (six) hours as needed for moderate pain. 09/13/17   Ward, Chase Picket, PA-C    Family History No family history on file.  Social History Social History   Tobacco Use  . Smoking status: Current Every Day Smoker    Packs/day: 0.50    Types: Cigarettes  . Smokeless tobacco: Never Used  Substance Use Topics  . Alcohol use: No  . Drug use: Yes    Types: Marijuana     Allergies   Patient has no known allergies.   Review of Systems Review of Systems  All other systems reviewed and are negative.    Physical Exam Updated Vital Signs BP 121/84   Pulse 83   Temp 98.5 F (36.9 C)   Resp 13   SpO2 100%   Physical Exam  Constitutional: He appears well-developed and well-nourished.  HENT:  Head: Normocephalic and atraumatic.  Right Ear: External ear normal.  Left Ear: External ear normal.  Nose: Nose normal.  Mouth/Throat: Uvula is midline, oropharynx is clear and moist and mucous membranes are normal. No tonsillar exudate.  Eyes: Pupils are equal, round, and reactive to light. Right eye exhibits no discharge. Left eye exhibits no discharge. No scleral icterus.  Neck: Trachea  normal. Neck supple. No spinous process tenderness present. No neck rigidity. Normal range of motion present.  Cardiovascular: Normal rate, regular rhythm and intact distal pulses.  No murmur heard. Pulses:      Radial pulses are 2+ on the right side, and 2+ on the left side.       Dorsalis pedis pulses are 2+ on the right side, and 2+ on the left side.       Posterior tibial pulses are 2+ on the right side, and 2+ on the left side.  No lower extremity swelling or edema. Calves symmetric in size bilaterally.  Pulmonary/Chest: Effort normal and breath sounds normal. He exhibits  tenderness.  Chest pain worsened with adduction of right shoulder as well as palpation.     Abdominal: Soft. Bowel sounds are normal. There is no tenderness. There is no rebound and no guarding.  Musculoskeletal: He exhibits no edema.  Lymphadenopathy:    He has no cervical adenopathy.  Neurological: He is alert.  Skin: Skin is warm and dry. No rash noted. He is not diaphoretic.  Psychiatric: He has a normal mood and affect.  Nursing note and vitals reviewed.    ED Treatments / Results  Labs (all labs ordered are listed, but only abnormal results are displayed) Labs Reviewed  CBC WITH DIFFERENTIAL/PLATELET - Abnormal; Notable for the following components:      Result Value   Lymphs Abs 0.6 (*)    All other components within normal limits  BASIC METABOLIC PANEL  I-STAT TROPONIN, ED    EKG EKG Interpretation  Date/Time:  Saturday November 12 2018 16:41:37 EST Ventricular Rate:  80 PR Interval:    QRS Duration: 94 QT Interval:  348 QTC Calculation: 402 R Axis:   85 Text Interpretation:  Sinus rhythm LVH by voltage Early repolarization pattern No significant change since last tracing Abnormal ekg Confirmed by Gerhard MunchLockwood, Robert [phone]([id number]) on 11/12/2018 4:47:58 PM   Radiology Dg Chest 2 View  Result Date: 11/12/2018 CLINICAL DATA:  Mid chest pain radiating to RIGHT EXAM: CHEST - 2 VIEW COMPARISON:  06/02/2018 FINDINGS: Normal heart size, mediastinal contours, and pulmonary vascularity. Lungs clear. No pleural effusion or pneumothorax. Bones unremarkable. IMPRESSION: Normal exam. Electronically Signed   By: Ulyses SouthwardMark  Boles M.D.   On: 11/12/2018 18:34    Procedures Procedures (including critical care time)  Medications Ordered in ED Medications  acetaminophen (TYLENOL) tablet 650 mg (650 mg Oral Given 11/12/18 1722)  methocarbamol (ROBAXIN) tablet 500 mg (500 mg Oral Given 11/12/18 1722)     Initial Impression / Assessment and Plan / ED Course  I have reviewed the triage vital  signs and the nursing notes.  Pertinent labs & imaging results that were available during my care of the patient were reviewed by me and considered in my medical decision making (see chart for details).     Patient presented with chest pain to the ED. Patient is to be discharged with recommendation to follow up with PCP in regards to today's hospital visit. Chest pain is not likely of cardiac or pulmonary etiology due to presentation, perc negative, stable vital signs, no tracheal deviation, no JVD or new murmur, RRR, breath sounds equal bilaterally, EKG without acute abnormalities, negative troponin, and negative CXR. Suspect pectoralis major strain (tenderness along the pectoralis major origin along with pain with adduction of corresponding arm). Patient has been advised to return to the ED if chest pain becomes exertional, associated with diaphoresis or nausea, radiates to left jaw/arm, worsens  or becomes concerning in any way. Patient appears reliable for follow up and is agreeable to discharge. I advised the patient to follow-up with their primary care provider this week. I advised the patient to return to the emergency department with new or worsening symptoms or new concerns. The patient verbalized understanding and agreement with plan. Patient stable for discharge and in agreement with plan.  Final Clinical Impressions(s) / ED Diagnoses   Final diagnoses:  Atypical chest pain    ED Discharge Orders         Ordered    methocarbamol (ROBAXIN) 500 MG tablet  2 times daily     11/12/18 1934    ibuprofen (ADVIL,MOTRIN) 600 MG tablet  Every 6 hours PRN     11/12/18 1934           Princella Pellegrini 11/12/18 2130    Gerhard Munch, MD 11/12/18 2348

## 2020-02-03 ENCOUNTER — Emergency Department (HOSPITAL_COMMUNITY): Payer: Self-pay

## 2020-02-03 ENCOUNTER — Emergency Department (HOSPITAL_COMMUNITY)
Admission: EM | Admit: 2020-02-03 | Discharge: 2020-02-03 | Disposition: A | Discharge status: Home / Self Care | Payer: Self-pay | Attending: Emergency Medicine | Admitting: Emergency Medicine

## 2020-02-03 ENCOUNTER — Other Ambulatory Visit: Payer: Self-pay

## 2020-02-03 ENCOUNTER — Encounter (HOSPITAL_COMMUNITY): Payer: Self-pay | Admitting: Emergency Medicine

## 2020-02-03 DIAGNOSIS — F17210 Nicotine dependence, cigarettes, uncomplicated: Secondary | ICD-10-CM | POA: Insufficient documentation

## 2020-02-03 DIAGNOSIS — R0789 Other chest pain: Secondary | ICD-10-CM | POA: Insufficient documentation

## 2020-02-03 DIAGNOSIS — Z79899 Other long term (current) drug therapy: Secondary | ICD-10-CM | POA: Insufficient documentation

## 2020-02-03 DIAGNOSIS — R0602 Shortness of breath: Secondary | ICD-10-CM | POA: Insufficient documentation

## 2020-02-03 LAB — BASIC METABOLIC PANEL
Anion gap: 9 (ref 5–15)
BUN: 12 mg/dL (ref 6–20)
CO2: 24 mmol/L (ref 22–32)
Calcium: 9.3 mg/dL (ref 8.9–10.3)
Chloride: 109 mmol/L (ref 98–111)
Creatinine, Ser: 0.95 mg/dL (ref 0.61–1.24)
GFR calc Af Amer: >60 mL/min (ref >60)
GFR calc non Af Amer: >60 mL/min (ref >60)
Glucose, Bld: 122 mg/dL — ABNORMAL HIGH (ref 70–99)
Potassium: 3.8 mmol/L (ref 3.5–5.1)
Sodium: 142 mmol/L (ref 135–145)

## 2020-02-03 LAB — CBC WITH DIFFERENTIAL/PLATELET
Abs Immature Granulocytes: 0.01 10*3/uL (ref 0.00–0.07)
Basophils Absolute: 0 10*3/uL (ref 0.0–0.1)
Basophils Relative: 1 %
Eosinophils Absolute: 0 10*3/uL (ref 0.0–0.5)
Eosinophils Relative: 1 %
HCT: 43.8 % (ref 39.0–52.0)
Hemoglobin: 14.4 g/dL (ref 13.0–17.0)
Immature Granulocytes: 0 %
Lymphocytes Relative: 31 %
Lymphs Abs: 0.8 10*3/uL (ref 0.7–4.0)
MCH: 29.2 pg (ref 26.0–34.0)
MCHC: 32.9 g/dL (ref 30.0–36.0)
MCV: 88.8 fL (ref 80.0–100.0)
Monocytes Absolute: 0.2 10*3/uL (ref 0.1–1.0)
Monocytes Relative: 8 %
Neutro Abs: 1.5 10*3/uL — ABNORMAL LOW (ref 1.7–7.7)
Neutrophils Relative %: 59 %
Platelets: 212 10*3/uL (ref 150–400)
RBC: 4.93 MIL/uL (ref 4.22–5.81)
RDW: 13.1 % (ref 11.5–15.5)
WBC: 2.6 10*3/uL — ABNORMAL LOW (ref 4.0–10.5)
nRBC: 0 % (ref 0.0–0.2)

## 2020-02-03 LAB — D-DIMER, QUANTITATIVE (NOT AT ARMC): D-Dimer, Quant: <0.27 ug/mL-FEU (ref 0.00–0.50)

## 2020-02-03 LAB — TROPONIN I (HIGH SENSITIVITY)
Troponin I (High Sensitivity): <2 ng/L (ref <18)
Troponin I (High Sensitivity): <2 ng/L (ref <18)

## 2020-02-03 MED ORDER — NAPROXEN 500 MG PO TABS: 500.0000 mg | ORAL_TABLET | Freq: Two times a day (BID) | ORAL | 0 refills | Status: DC | PRN

## 2020-02-03 MED ORDER — KETOROLAC TROMETHAMINE 30 MG/ML IJ SOLN
30.0000 mg | Freq: Once | INTRAMUSCULAR | Status: AC
  Administered 2020-02-03: 30 mg via INTRAVENOUS
  Filled 2020-02-03: qty 1

## 2020-02-03 MED ORDER — ACETAMINOPHEN 325 MG PO TABS
650.0000 mg | ORAL_TABLET | Freq: Once | ORAL | Status: AC
  Administered 2020-02-03: 650 mg via ORAL
  Filled 2020-02-03: qty 2
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
## 2020-02-03 NOTE — ED Triage Notes (Addendum)
Pt reports SOB for 2 days with a dry cough and CP that gets worse with breathing, denies fever.

## 2020-02-03 NOTE — Discharge Instructions (Addendum)
There is no evidence of heart attack or blood clot in the lung.  Take the anti-inflammatory as prescribed.  Follow-up with your doctor.  Return to the ED if you develop new or worsening symptoms.

## 2021-09-07 ENCOUNTER — Encounter (HOSPITAL_COMMUNITY): Payer: Self-pay | Admitting: *Deleted

## 2021-09-07 ENCOUNTER — Emergency Department (HOSPITAL_COMMUNITY): Payer: Self-pay

## 2021-09-07 ENCOUNTER — Other Ambulatory Visit: Payer: Self-pay

## 2021-09-07 ENCOUNTER — Emergency Department (HOSPITAL_COMMUNITY)
Admission: EM | Admit: 2021-09-07 | Discharge: 2021-09-07 | Disposition: A | Discharge status: Home / Self Care | Payer: Self-pay | Attending: Emergency Medicine | Admitting: Emergency Medicine

## 2021-09-07 DIAGNOSIS — S92255A Nondisplaced fracture of navicular [scaphoid] of left foot, initial encounter for closed fracture: Secondary | ICD-10-CM

## 2021-09-07 DIAGNOSIS — S70219A Abrasion, unspecified hip, initial encounter: Secondary | ICD-10-CM

## 2021-09-07 DIAGNOSIS — S50319A Abrasion of unspecified elbow, initial encounter: Secondary | ICD-10-CM

## 2021-09-07 DIAGNOSIS — Y92410 Unspecified street and highway as the place of occurrence of the external cause: Secondary | ICD-10-CM | POA: Insufficient documentation

## 2021-09-07 DIAGNOSIS — M79605 Pain in left leg: Secondary | ICD-10-CM | POA: Insufficient documentation

## 2021-09-07 DIAGNOSIS — S99922A Unspecified injury of left foot, initial encounter: Secondary | ICD-10-CM | POA: Diagnosis present

## 2021-09-07 DIAGNOSIS — S70212A Abrasion, left hip, initial encounter: Secondary | ICD-10-CM | POA: Diagnosis not present

## 2021-09-07 DIAGNOSIS — S50312A Abrasion of left elbow, initial encounter: Secondary | ICD-10-CM | POA: Insufficient documentation

## 2021-09-07 DIAGNOSIS — Z23 Encounter for immunization: Secondary | ICD-10-CM | POA: Diagnosis not present

## 2021-09-07 HISTORY — DX: Scoliosis, unspecified: M41.9

## 2021-09-07 MED ORDER — OXYCODONE HCL 5 MG PO TABS: 5.0000 mg | ORAL_TABLET | ORAL | 0 refills | Status: DC | PRN

## 2021-09-07 MED ORDER — FENTANYL CITRATE PF 50 MCG/ML IJ SOSY
100.0000 ug | PREFILLED_SYRINGE | Freq: Once | INTRAMUSCULAR | Status: AC
  Administered 2021-09-07: 100 ug via INTRAVENOUS
  Filled 2021-09-07: qty 2

## 2021-09-07 MED ORDER — TETANUS-DIPHTH-ACELL PERTUSSIS 5-2.5-18.5 LF-MCG/0.5 IM SUSY
0.5000 mL | PREFILLED_SYRINGE | Freq: Once | INTRAMUSCULAR | Status: AC
  Administered 2021-09-07: 0.5 mL via INTRAMUSCULAR
  Filled 2021-09-07: qty 0.5

## 2021-09-07 NOTE — ED Notes (Signed)
Patient discharge instructions reviewed with the patient. The patient verbalized understanding. Patient discharged. 
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
## 2021-09-07 NOTE — Discharge Instructions (Addendum)
Do not bear weight on the left leg.

## 2021-09-07 NOTE — Progress Notes (Signed)
Orthopedic Tech Progress Note Patient Details:  Larry Tapia 1991/07/03 [id number]  Ortho Devices Type of Ortho Device: Crutches, Post (short leg) splint, Stirrup splint Ortho Device/Splint Location: lle Ortho Device/Splint Interventions: Ordered, Application, Adjustment   Post Interventions Patient Tolerated: Well Instructions Provided: Care of device, Adjustment of device  [name] 09/07/2021, 8:11 AM
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
## 2021-09-07 NOTE — ED Notes (Signed)
Ortho tech aware 
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
## 2021-12-19 ENCOUNTER — Encounter (HOSPITAL_COMMUNITY): Payer: Self-pay | Admitting: Emergency Medicine

## 2021-12-19 ENCOUNTER — Emergency Department (HOSPITAL_COMMUNITY)
Admission: EM | Admit: 2021-12-19 | Discharge: 2021-12-20 | Disposition: A | Discharge status: Home / Self Care | Payer: Self-pay | Attending: Emergency Medicine | Admitting: Emergency Medicine

## 2021-12-19 DIAGNOSIS — R1084 Generalized abdominal pain: Secondary | ICD-10-CM | POA: Insufficient documentation

## 2021-12-19 DIAGNOSIS — R112 Nausea with vomiting, unspecified: Secondary | ICD-10-CM | POA: Insufficient documentation

## 2021-12-19 DIAGNOSIS — R1010 Upper abdominal pain, unspecified: Secondary | ICD-10-CM

## 2021-12-19 DIAGNOSIS — Z20822 Contact with and (suspected) exposure to covid-19: Secondary | ICD-10-CM | POA: Insufficient documentation

## 2021-12-19 LAB — COMPREHENSIVE METABOLIC PANEL
ALT: 21 U/L (ref 0–44)
AST: 27 U/L (ref 15–41)
Albumin: 4.6 g/dL (ref 3.5–5.0)
Alkaline Phosphatase: 72 U/L (ref 38–126)
Anion gap: 10 (ref 5–15)
BUN: 12 mg/dL (ref 6–20)
CO2: 26 mmol/L (ref 22–32)
Calcium: 9.5 mg/dL (ref 8.9–10.3)
Chloride: 102 mmol/L (ref 98–111)
Creatinine, Ser: 0.86 mg/dL (ref 0.61–1.24)
GFR, Estimated: >60 mL/min (ref >60)
Glucose, Bld: 81 mg/dL (ref 70–99)
Potassium: 3.7 mmol/L (ref 3.5–5.1)
Sodium: 138 mmol/L (ref 135–145)
Total Bilirubin: 1.7 mg/dL — ABNORMAL HIGH (ref 0.3–1.2)
Total Protein: 7.3 g/dL (ref 6.5–8.1)

## 2021-12-19 LAB — CBC WITH DIFFERENTIAL/PLATELET
Abs Immature Granulocytes: 0.01 10*3/uL (ref 0.00–0.07)
Basophils Absolute: 0 10*3/uL (ref 0.0–0.1)
Basophils Relative: 0 %
Eosinophils Absolute: 0 10*3/uL (ref 0.0–0.5)
Eosinophils Relative: 0 %
HCT: 46.2 % (ref 39.0–52.0)
Hemoglobin: 15.6 g/dL (ref 13.0–17.0)
Immature Granulocytes: 0 %
Lymphocytes Relative: 21 %
Lymphs Abs: 1 10*3/uL (ref 0.7–4.0)
MCH: 29.6 pg (ref 26.0–34.0)
MCHC: 33.8 g/dL (ref 30.0–36.0)
MCV: 87.7 fL (ref 80.0–100.0)
Monocytes Absolute: 0.5 10*3/uL (ref 0.1–1.0)
Monocytes Relative: 11 %
Neutro Abs: 3.1 10*3/uL (ref 1.7–7.7)
Neutrophils Relative %: 68 %
Platelets: 192 10*3/uL (ref 150–400)
RBC: 5.27 MIL/uL (ref 4.22–5.81)
RDW: 12.7 % (ref 11.5–15.5)
WBC: 4.7 10*3/uL (ref 4.0–10.5)
nRBC: 0 % (ref 0.0–0.2)

## 2021-12-19 LAB — RESP PANEL BY RT-PCR (FLU A&B, COVID) ARPGX2
Influenza A by PCR: NEGATIVE
Influenza B by PCR: NEGATIVE
SARS Coronavirus 2 by RT PCR: NEGATIVE

## 2021-12-19 LAB — LIPASE, BLOOD: Lipase: 23 U/L (ref 11–51)

## 2021-12-19 MED ORDER — SODIUM CHLORIDE 0.9 % IV BOLUS: 1000.0000 mL | Freq: Once | INTRAVENOUS | Status: DC

## 2021-12-19 NOTE — ED Triage Notes (Signed)
Pt arrives via EMS with lower abd pain, N/V since Wednesday. Reports he is unable to keep anything down.

## 2021-12-19 NOTE — ED Provider Notes (Signed)
Emergency Medicine Provider Triage Evaluation Note  Larry Tapia , a 30 y.o. male  was evaluated in triage.  Pt complains of lower abdominal pain over the last few days.  Patient also reports associated nausea, vomiting, diarrhea, subjective fever, chills.  No urinary complaints.  Review of Systems  Positive:  Negative: See above   Physical Exam  BP 107/75    Pulse 81    Temp 98.3 F (36.8 C) (Oral)    Resp 16    Ht 5\' 6"  (1.676 m)    Wt 58.1 kg    SpO2 99%    BMI 20.66 kg/m  Gen:   Awake, no distress   Resp:  Normal effort  MSK:   Moves extremities without difficulty  Other:  Minimal diffuse abdominal tenderness  Medical Decision Making  Medically screening exam initiated at 5:21 PM.  Appropriate orders placed.  was informed that the remainder of the evaluation will be completed by another provider, this initial triage assessment does not replace that evaluation, and the importance of remaining in the ED until their evaluation is complete.     Earlean Polka, PA-C 12/19/21 1721    12/21/21, DO 12/20/21 [phone]

## 2021-12-20 ENCOUNTER — Emergency Department (HOSPITAL_COMMUNITY): Payer: Self-pay

## 2021-12-20 ENCOUNTER — Other Ambulatory Visit: Payer: Self-pay

## 2021-12-20 MED ORDER — IOHEXOL 300 MG/ML  SOLN
100.0000 mL | Freq: Once | INTRAMUSCULAR | Status: AC | PRN
  Administered 2021-12-20: 100 mL via INTRAVENOUS

## 2021-12-20 MED ORDER — KETOROLAC TROMETHAMINE 30 MG/ML IJ SOLN
30.0000 mg | Freq: Once | INTRAMUSCULAR | Status: AC
  Administered 2021-12-20: 30 mg via INTRAVENOUS
  Filled 2021-12-20: qty 1

## 2021-12-20 MED ORDER — ONDANSETRON HCL 4 MG/2ML IJ SOLN
4.0000 mg | Freq: Once | INTRAMUSCULAR | Status: AC
  Administered 2021-12-20: 4 mg via INTRAVENOUS
  Filled 2021-12-20: qty 2

## 2021-12-20 MED ORDER — SODIUM CHLORIDE 0.9 % IV BOLUS
1000.0000 mL | Freq: Once | INTRAVENOUS | Status: AC
  Administered 2021-12-20: 1000 mL via INTRAVENOUS

## 2021-12-20 NOTE — ED Provider Notes (Signed)
[facility] EMERGENCY DEPARTMENT Provider Note   CSN: [id number] Arrival date & time: 12/19/21  1652     History Chief Complaint  Patient presents with   Abdominal Pain    Larry Tapia is a 30 y.o. male.  Patient is a 30 year old male with no significant past medical history.  Patient presenting today for evaluation of abdominal pain, nausea, vomiting, and inability to keep anything down.  Patient reports eating [employer] 2 nights ago, then symptoms began the next morning.  He describes nausea and vomiting that is made worse with any attempt at oral intake.  He denies any bloody stool or vomit.  He denies fevers or chills.  He denies ill contacts.  The history is provided by the patient.  Abdominal Pain Pain location:  Generalized Pain quality: cramping   Pain radiates to:  Does not radiate Pain severity:  Moderate Onset quality:  Sudden Duration:  2 days Timing:  Constant Progression:  Worsening Chronicity:  New Relieved by:  Nothing Worsened by:  Eating and palpation     Past Medical History:  Diagnosis Date   Scoliosis     There are no problems to display for this patient.   History reviewed. No pertinent surgical history.     No family history on file.  Social History   Tobacco Use   Smoking status: Never   Smokeless tobacco: Never  Substance Use Topics   Alcohol use: Yes   Drug use: Yes    Types: Marijuana    Home Medications Prior to Admission medications   Medication Sig Start Date End Date Taking? Authorizing Provider  acetaminophen (TYLENOL) 325 MG tablet Take 650 mg by mouth every 6 (six) hours as needed for mild pain, moderate pain, fever or headache.    [provider]  ibuprofen (ADVIL,MOTRIN) 600 MG tablet Take 1 tablet (600 mg total) by mouth every 6 (six) hours as needed. Patient not taking: Reported on 02/03/2020 11/12/18   Maczis, Elmer Sow, PA-C  methocarbamol (ROBAXIN) 500 MG tablet Take 1 tablet (500  mg total) by mouth 2 (two) times daily. Patient not taking: Reported on 02/03/2020 11/12/18   Maczis, Elmer Sow, PA-C  naproxen (NAPROSYN) 500 MG tablet Take 1 tablet (500 mg total) by mouth 2 (two) times daily as needed. 02/03/20   Rancour, Jeannett Senior, MD  oxyCODONE (ROXICODONE) 5 MG immediate release tablet Take 1 tablet (5 mg total) by mouth every 4 (four) hours as needed for up to 12 doses for severe pain. 09/07/21   Koleen Distance, MD    Allergies    Patient has no known allergies.  Review of Systems   Review of Systems  Gastrointestinal:  Positive for abdominal pain.  All other systems reviewed and are negative.  Physical Exam Updated Vital Signs BP 118/77    Pulse 66    Temp 98.4 F (36.9 C)    Resp 16    Ht 5\' 6"  (1.676 m)    Wt 58.1 kg    SpO2 99%    BMI 20.66 kg/m   Physical Exam Vitals and nursing note reviewed.  Constitutional:      General: He is not in acute distress.    Appearance: He is well-developed. He is not diaphoretic.  HENT:     Head: Normocephalic and atraumatic.  Cardiovascular:     Rate and Rhythm: Normal rate and regular rhythm.     Heart sounds: No murmur heard.   No friction rub.  Pulmonary:  Effort: Pulmonary effort is normal. No respiratory distress.     Breath sounds: Normal breath sounds. No wheezing or rales.  Abdominal:     General: Bowel sounds are normal. There is no distension.     Palpations: Abdomen is soft.     Tenderness: There is abdominal tenderness in the right lower quadrant, suprapubic area and left lower quadrant. There is no right CVA tenderness, left CVA tenderness, guarding or rebound.  Musculoskeletal:        General: Normal range of motion.     Cervical back: Normal range of motion and neck supple.  Skin:    General: Skin is warm and dry.  Neurological:     Mental Status: He is alert and oriented to person, place, and time.     Coordination: Coordination normal.    ED Results / Procedures / Treatments   Labs (all labs  ordered are listed, but only abnormal results are displayed) Labs Reviewed  COMPREHENSIVE METABOLIC PANEL - Abnormal; Notable for the following components:      Result Value   Total Bilirubin 1.7 (*)    All other components within normal limits  RESP PANEL BY RT-PCR (FLU A&B, COVID) ARPGX2  LIPASE, BLOOD  CBC WITH DIFFERENTIAL/PLATELET    EKG None  Radiology No results found.  Procedures Procedures   Medications Ordered in ED Medications  sodium chloride 0.9 % bolus 1,000 mL (has no administration in time range)  sodium chloride 0.9 % bolus 1,000 mL (has no administration in time range)  ondansetron (ZOFRAN) injection 4 mg (has no administration in time range)  ketorolac (TORADOL) 30 MG/ML injection 30 mg (has no administration in time range)    ED Course  I have reviewed the triage vital signs and the nursing notes.  Pertinent labs & imaging results that were available during my care of the patient were reviewed by me and considered in my medical decision making (see chart for details).    MDM Rules/Calculators/A&P  Patient presenting with lower abdominal pain, nausea, and vomiting.  He describes being unable to keep anything down for the past 24 hours.  His abdomen is tender across the lower abdomen, but laboratory studies are reassuring.  Patient will undergo CT scan to rule out appendicitis or other acute process.  Care will be signed out to oncoming provider at shift change.  Dr. Denton Lank will obtain the results of the CT scan and determine the final disposition.  Final Clinical Impression(s) / ED Diagnoses Final diagnoses:  None    Rx / DC Orders ED Discharge Orders     None        Geoffery Lyons, MD 12/21/21 [phone]

## 2021-12-20 NOTE — ED Notes (Signed)
Provider sent a message reference pt using call bell and asking how much longer for him to go home that he is tired and he is ready to go

## 2021-12-20 NOTE — ED Notes (Signed)
Pt advised per provider that we were waiting on the ct to be completed unless he felt like he could go home. Per pt he is concerned whether or not he could hold anything down that he hasn't been able to hold anything down for the last several days. Advised pt that I would check to see how much longer before they came to get him. Called ct and was advised they would be here to get him very soon. Pt made aware of same at this time and stated thank you

## 2021-12-20 NOTE — ED Provider Notes (Signed)
Signed out to check ct and d/c to home.  CT neg acute.   Pt with upper abd pain and nv earlier - has resolved. ?possible cannabinoid hyperemesis.   Vitals normal.   Pt currently appears stable for d/c.      Cathren Laine, MD 12/20/21 [phone]

## 2021-12-20 NOTE — Discharge Instructions (Addendum)
It was our pleasure to provide your ER care today - we hope that you feel better.  Your ct scan was read as showing no acute process. Incidental note was made of a small kidney stone in the left kidney.   Drink plenty of fluids/stay well hydrated. Take acetaminophen or ibuprofen as need.   Note that increasingly we are seeing marijuana/THC as the cause of a recurrent abdominal pain and vomiting syndrome called Cannabinoid Hyperemesis Syndrome - in these cases, avoid marijuana use will prevent symptoms from recurring.   Follow up with primary care doctor in 2-3 days if symptoms fail to improve/resolve.  Return to ER if worse, new symptoms, fevers, persistent vomiting, severe pain, or other concern.

## 2022-02-05 IMAGING — CT CT ABD-PELV W/ CM
2 of 4 series · 16 of 46 positions shown, 18 images · IV contrast (Omni 300)
Comparison: None.

CLINICAL DATA: Abdominal pain.

EXAM:
CT ABDOMEN AND PELVIS WITH CONTRAST
TECHNIQUE: Multidetector CT imaging of the abdomen and pelvis was performed
using the standard protocol following bolus administration of
intravenous contrast.
CONTRAST:  100mL OMNIPAQUE IOHEXOL 300 MG/ML  SOLN

[Series 3: a/p w/ 5mm · axial · 0.54mm/px · z∈[-1009,-639]mm · 13 of 82 slices shown, 15 images]
[im 4/82  soft-tissue]
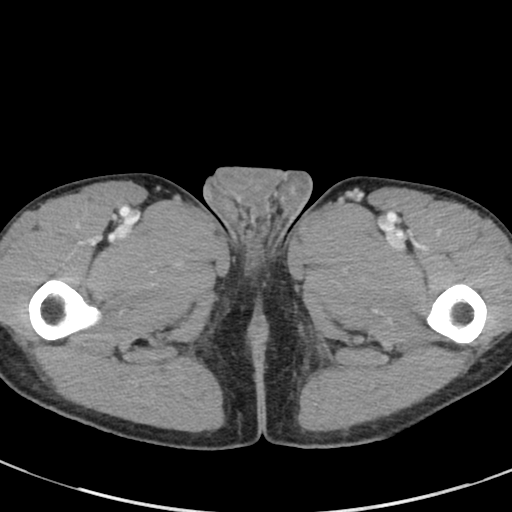
[im 4/82  bone]
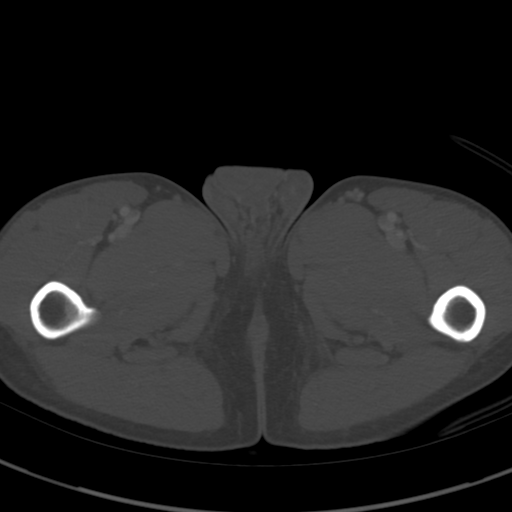
[im 11/82  soft-tissue]
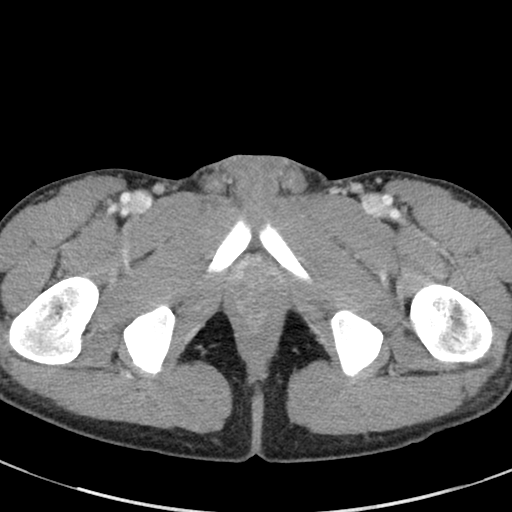
[im 17/82  soft-tissue]
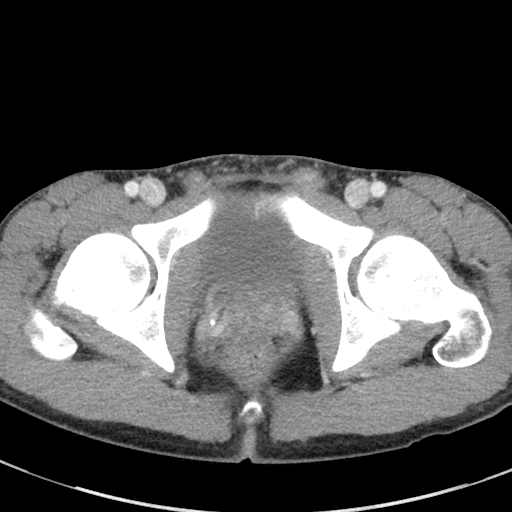
[im 24/82  soft-tissue]
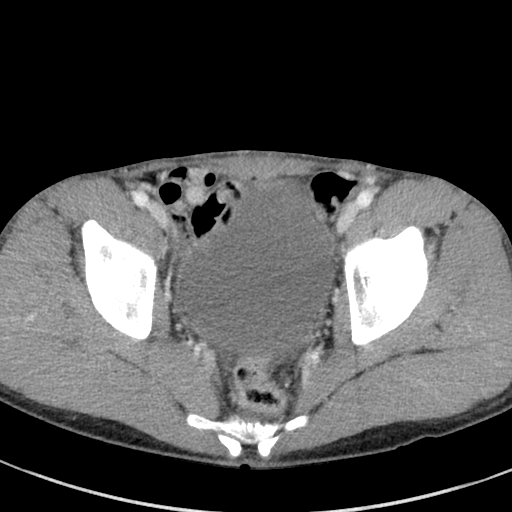
[im 28/82  soft-tissue]
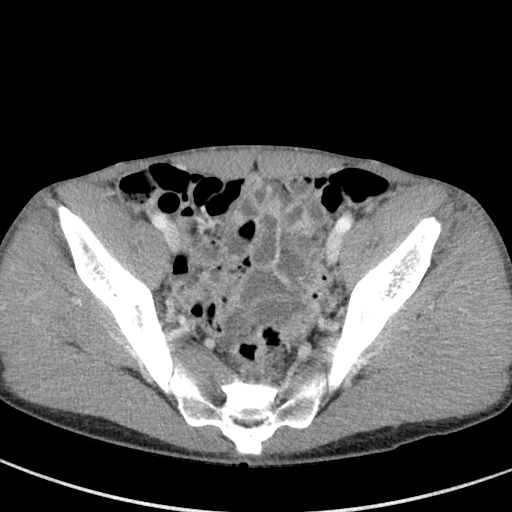
[im 34/82  soft-tissue]
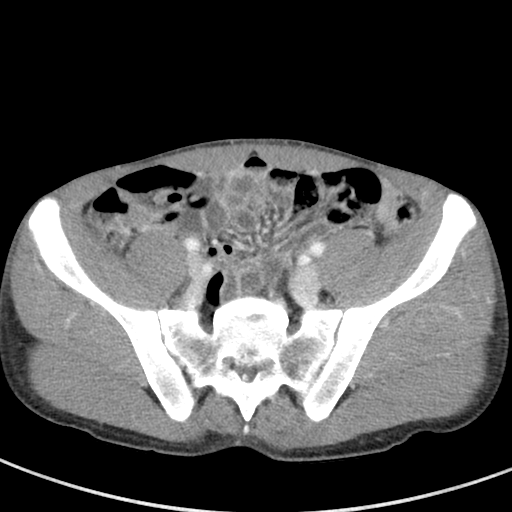
[im 41/82  soft-tissue]
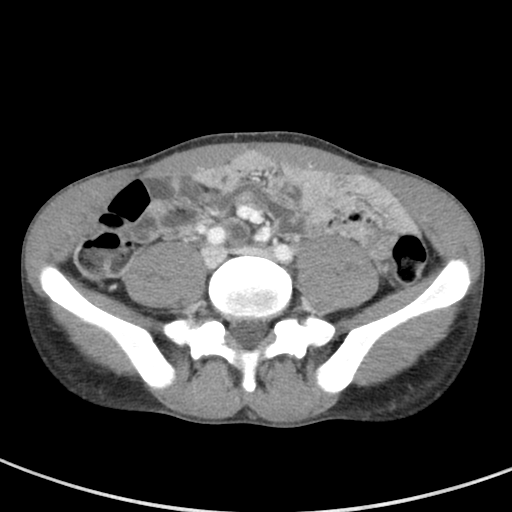
[im 48/82  soft-tissue]
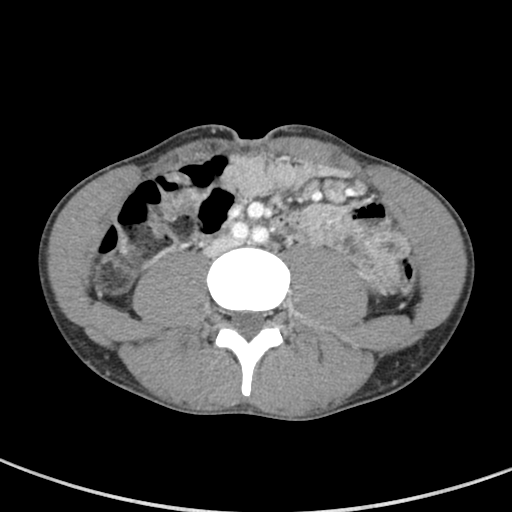
[im 55/82  soft-tissue]
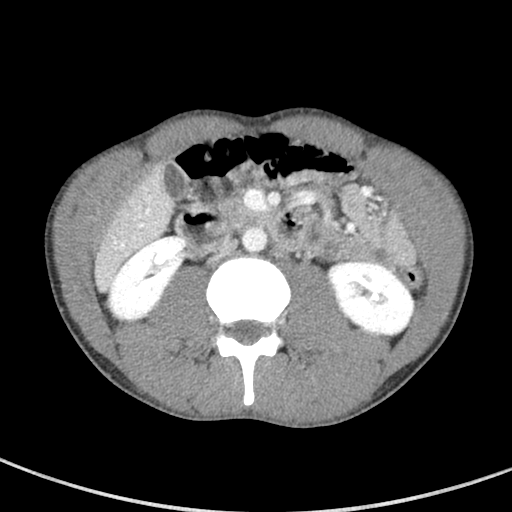
[im 55/82  bone]
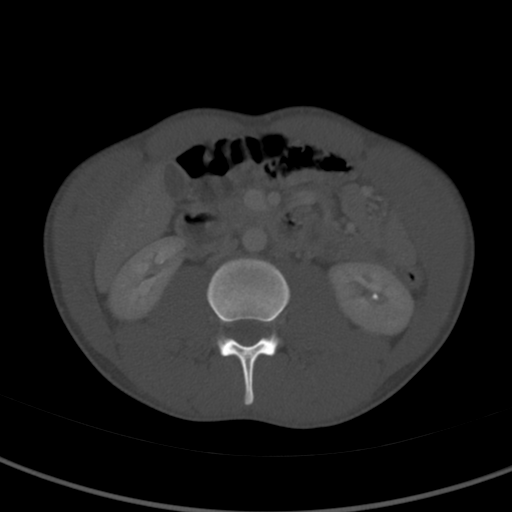
[im 58/82  soft-tissue]
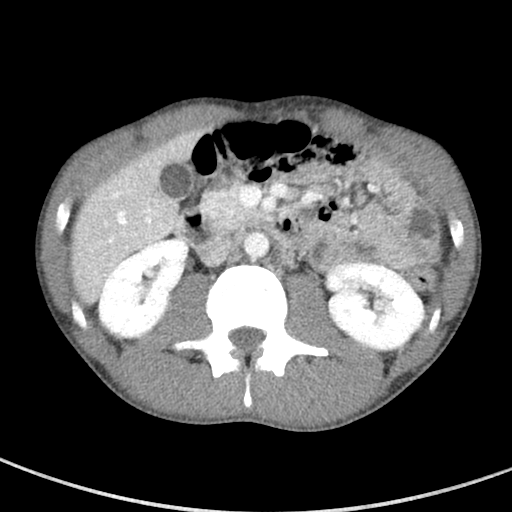
[im 65/82  soft-tissue]
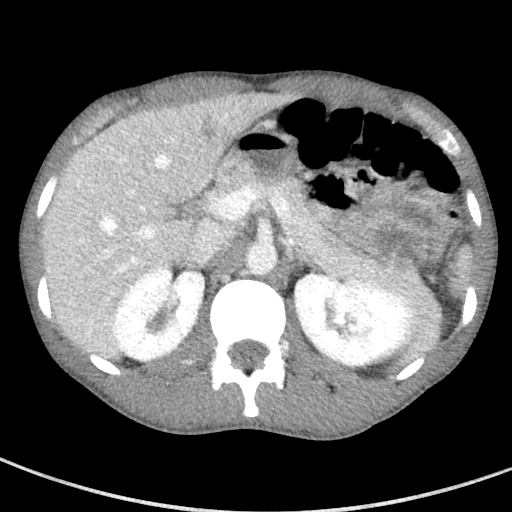
[im 71/82  soft-tissue]
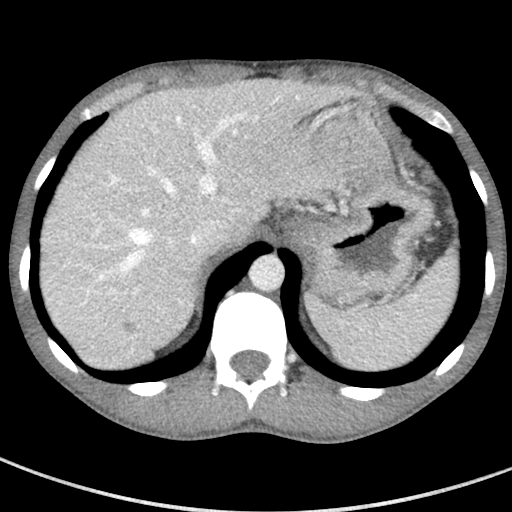
[im 78/82  soft-tissue]
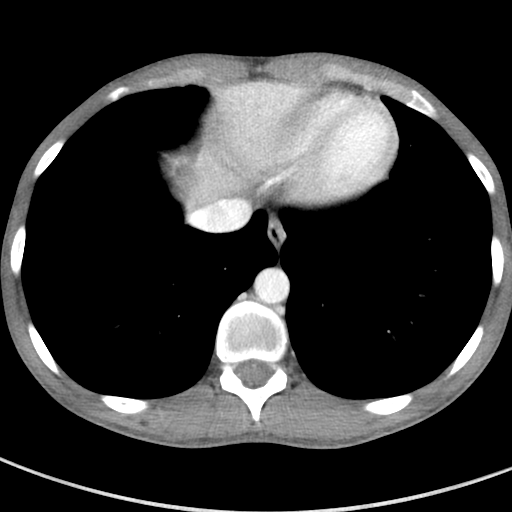

[Series 6: a/p w/ cor · coronal · 0.59mm/px · 3 of 121 slices shown]
[im 41/121  soft-tissue]
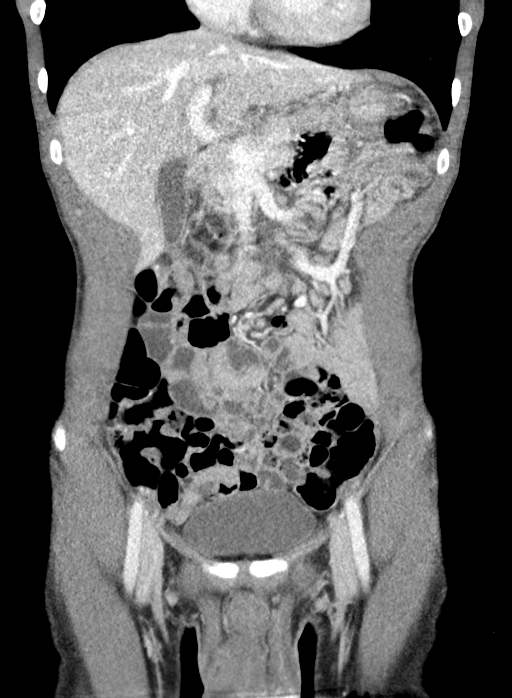
[im 54/121  soft-tissue]
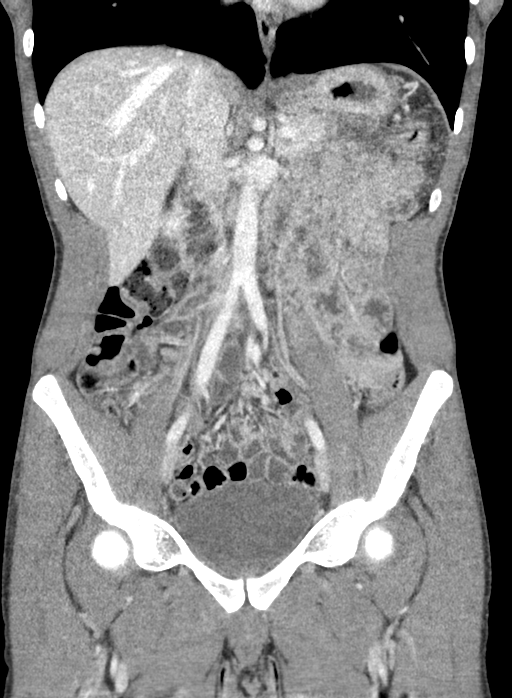
[im 67/121  soft-tissue]
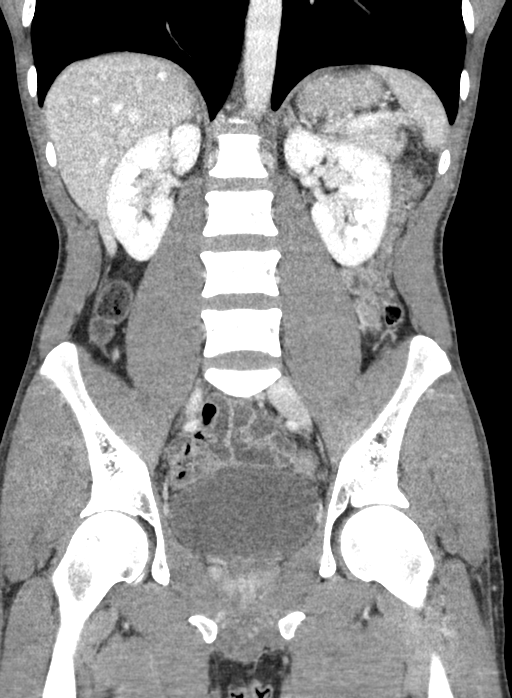

[16 of 46 positions shown; findings below may reference images not displayed]

FINDINGS: Lower chest: No acute abnormality.

Hepatobiliary: Small low attenuation structure in posterior right
lobe of liver measures 7 mm and is technically too small to reliably
characterize. No suspicious liver lesion identified. Gallbladder
appears normal. No bile duct dilatation.

Pancreas: Unremarkable. No pancreatic ductal dilatation or
surrounding inflammatory changes.

Spleen: Normal in size without focal abnormality.

Adrenals/Urinary Tract: Normal adrenal glands. Nonobstructing
calculus noted within inferior pole of the left kidney measuring 3
mm. No hydronephrosis or mass identified bilaterally. The urinary
bladder is unremarkable.

Stomach/Bowel: Assessment of bowel pathology is diminished due to
lack of oral contrast material. The stomach appears nondistended.
Retrocecal appendix is visualized and appears normal. No pericecal
inflammation identified. No bowel wall thickening, inflammation, or
distension

Vascular/Lymphatic: No significant vascular findings are present. No
enlarged abdominal or pelvic lymph nodes.

Reproductive: Prostate is unremarkable.

Other: No free fluid or fluid collections identified.

Musculoskeletal: No acute or significant osseous findings.
IMPRESSION: 1. No acute findings identified within the abdomen or pelvis.
2. Nonobstructing left renal calculus.

## 2023-04-14 ENCOUNTER — Ambulatory Visit
Admission: EM | Admit: 2023-04-14 | Discharge: 2023-04-14 | Disposition: A | Discharge status: Home / Self Care | Payer: Self-pay | Attending: Internal Medicine | Admitting: Internal Medicine

## 2023-04-14 DIAGNOSIS — K047 Periapical abscess without sinus: Secondary | ICD-10-CM

## 2023-04-14 MED ORDER — IBUPROFEN 800 MG PO TABS: 800.0000 mg | ORAL_TABLET | Freq: Three times a day (TID) | ORAL | 0 refills | Status: AC | PRN

## 2023-04-14 MED ORDER — AMOXICILLIN-POT CLAVULANATE 875-125 MG PO TABS: 1.0000 | ORAL_TABLET | Freq: Two times a day (BID) | ORAL | 0 refills | Status: AC

## 2023-04-14 NOTE — ED Provider Notes (Signed)
[facility]  Note:  This document was prepared using Dragon voice recognition software and may include unintentional dictation errors.  MRN: [id number] DOB: 03-Jan-1991 DATE: 04/14/23   Subjective:  Chief Complaint:  Chief Complaint  Patient presents with   Dental Pain     HPI: Larry Tapia is a 32 y.o. male presenting for dental pain for the past 2 to 3 days.  Patient reports pain on left upper jaw anteriorly.  He states he has noticed some swelling intermittently as well as possible chills.  He states he has tried over-the-counter pain medicine which helps on, but does not resolve his symptoms.  He has not contacted a dentist yet.  He states he did have dental problems approximately 10 years ago in the same area, but nothing new.  Reports pain with talking and eating. Denies fever, nausea/vomiting, sore throat. Endorses dental pain. Presents NAD.  Prior to Admission medications   Medication Sig Start Date End Date Taking? Authorizing Provider  amoxicillin-clavulanate (AUGMENTIN) 875-125 MG tablet Take 1 tablet by mouth every 12 (twelve) hours for 10 days. 04/14/23 04/24/23 Yes Nilani Hugill P, PA-C  ibuprofen (ADVIL) 800 MG tablet Take 1 tablet (800 mg total) by mouth every 8 (eight) hours as needed for moderate pain. 04/14/23  Yes Dex Blakely P, PA-C  acetaminophen (TYLENOL) 325 MG tablet Take 650 mg by mouth every 6 (six) hours as needed for mild pain, moderate pain, fever or headache.    [provider]  methocarbamol (ROBAXIN) 500 MG tablet Take 1 tablet (500 mg total) by mouth 2 (two) times daily. Patient not taking: Reported on 02/03/2020 11/12/18   Jacinto Halim, PA-C     No Known Allergies  History:   Past Medical History:  Diagnosis Date   Scoliosis      History reviewed. No pertinent surgical history.  History reviewed. No pertinent family history.  Social History   Tobacco Use   Smoking status: Some Days    Types: Cigarettes    Smokeless tobacco: Never  Vaping Use   Vaping Use: Some days  Substance Use Topics   Alcohol use: Yes   Drug use: Yes    Types: Marijuana    Review of Systems  Constitutional:  Positive for chills. Negative for fever.  HENT:  Positive for dental problem and facial swelling. Negative for sore throat.   Gastrointestinal:  Negative for nausea and vomiting.     Objective:   Vitals: BP 117/74 (BP Location: Right Arm)   Pulse 76   Temp 98.3 F (36.8 C) (Oral)   Resp 20   SpO2 96%   Physical Exam Constitutional:      General: He is not in acute distress.    Appearance: Normal appearance. He is well-developed and normal weight. He is not ill-appearing or toxic-appearing.  HENT:     Head: Normocephalic and atraumatic.     Jaw: Tenderness and swelling present.     Comments: Tenderness to palpation overlying left upper bicuspid.  Very mild edema overlying left upper lip.  No warmth, erythema, discharge.    Mouth/Throat:     Dentition: Abnormal dentition. Dental tenderness present.      Comments: Tenderness to palpation overlying left upper bicuspid.  Tooth decay noted to left upper bicuspid.  No warmth, erythema or discharge. Cardiovascular:     Rate and Rhythm: Normal rate and regular rhythm.     Heart sounds: Normal heart sounds.  Pulmonary:     Effort: Pulmonary effort  is normal.     Breath sounds: Normal breath sounds.     Comments: Clear to auscultation bilaterally  Abdominal:     General: Bowel sounds are normal.     Palpations: Abdomen is soft.     Tenderness: There is no abdominal tenderness.  Skin:    General: Skin is warm and dry.  Neurological:     General: No focal deficit present.     Mental Status: He is alert.  Psychiatric:        Mood and Affect: Mood and affect normal.     Results:  Labs: No results found for this or any previous visit (from the past 24 hour(s)).  Radiology: No results found.   UC Course/Treatments:  Procedures: Procedures    Medications Ordered in UC: Medications - No data to display   Assessment and Plan :     ICD-10-CM   1. Dental infection  K04.7      Dental infection: Afebrile, nontoxic-appearing, NAD. VSS. DDX includes but not limited to: Dental infection, cavity, dental abscess On PE, tooth decay and mild facial swelling noted at left upper bicuspid.  Tenderness to palpation of the area. Augmentin  twice daily was prescribed for dental infection. Patient was instructed to follow-up with a dentist to soon as possible. Ibuprofen 800 mg every 8 hours as needed was prescribed for pain. He can alternate Tylenol with the Ibuprofen. Strict ED precautions were given and patient verbalized understanding.   ED Discharge Orders          Ordered    amoxicillin-clavulanate (AUGMENTIN) 875-125 MG tablet  Every 12 hours        04/14/23 1501    ibuprofen (ADVIL) 800 MG tablet  Every 8 hours PRN        04/14/23 1501             I have reviewed the PDMP during this encounter.     Cynda Acres, PA-C 04/14/23 1519

## 2023-04-14 NOTE — Discharge Instructions (Signed)
You were prescribed an antibiotic called Augmentin. This is an antibiotic often used to treat dental infections; however, this antibiotic will not take care of the dental problem alone. It is very important that you see a dentist as soon as possible. The infection will continue to reoccur if the problem is not fixed by a dentist.

## 2023-04-14 NOTE — ED Triage Notes (Signed)
Pt c/o tooth pain on left side of mouth for a few days and c/o swelling now and feeling like fever and chills intermittently.

## 2024-02-24 ENCOUNTER — Other Ambulatory Visit: Payer: Self-pay

## 2024-02-24 ENCOUNTER — Emergency Department (HOSPITAL_COMMUNITY)
Admission: EM | Admit: 2024-02-24 | Discharge: 2024-02-24 | Disposition: A | Discharge status: Home / Self Care | Payer: Self-pay

## 2024-02-24 DIAGNOSIS — J069 Acute upper respiratory infection, unspecified: Secondary | ICD-10-CM | POA: Insufficient documentation

## 2024-02-24 DIAGNOSIS — R109 Unspecified abdominal pain: Secondary | ICD-10-CM | POA: Insufficient documentation

## 2024-02-24 DIAGNOSIS — R197 Diarrhea, unspecified: Secondary | ICD-10-CM | POA: Insufficient documentation

## 2024-02-24 DIAGNOSIS — Z20822 Contact with and (suspected) exposure to covid-19: Secondary | ICD-10-CM | POA: Insufficient documentation

## 2024-02-24 LAB — RESP PANEL BY RT-PCR (RSV, FLU A&B, COVID)  RVPGX2
Influenza A by PCR: POSITIVE — AB
Influenza B by PCR: NEGATIVE
Resp Syncytial Virus by PCR: NEGATIVE
SARS Coronavirus 2 by RT PCR: NEGATIVE

## 2024-02-24 NOTE — ED Provider Notes (Signed)
 [place] EMERGENCY DEPARTMENT AT [facility] Provider Note   CSN: [id number] Arrival date & time: 02/24/24  1121     History  Chief Complaint  Patient presents with   Chills   Cough   Diarrhea   Headache    Ezel JAMEIL WHITMOYER is a 33 y.o. male.   Cough Associated symptoms: headaches   Diarrhea Associated symptoms: headaches   Headache Associated symptoms: cough and diarrhea   Patient is a 33 year old male presents the ED today complaining of a 4-day history of dry cough, congestion, abdominal cramping, diarrhea.  States that symptoms have been improving over the last 4 days, improved further with Tylenol and Robitussin which she has been taking.  Tolerating p.o. intake without difficulty.  Denies headache, vision changes, sore throat, shortness of breath, chest pain, abdominal pain, vomiting, hematochezia, melena, dysuria, lower extremity swelling.     Home Medications Prior to Admission medications   Medication Sig Start Date End Date Taking? Authorizing Provider  acetaminophen (TYLENOL) 325 MG tablet Take 650 mg by mouth every 6 (six) hours as needed for mild pain, moderate pain, fever or headache.    [provider]  ibuprofen (ADVIL) 800 MG tablet Take 1 tablet (800 mg total) by mouth every 8 (eight) hours as needed for moderate pain. 04/14/23   Hermanns, Ashlee P, PA-C  methocarbamol (ROBAXIN) 500 MG tablet Take 1 tablet (500 mg total) by mouth 2 (two) times daily. Patient not taking: Reported on 02/03/2020 11/12/18   Jacinto Halim, PA-C      Allergies    Patient has no known allergies.    Review of Systems   Review of Systems  Respiratory:  Positive for cough.   Gastrointestinal:  Positive for diarrhea.  Neurological:  Positive for headaches.  All other systems reviewed and are negative.   Physical Exam Updated Vital Signs BP 125/87 (BP Location: Left Arm)   Pulse (!) 105   Temp 99.1 F (37.3 C) (Oral)   Resp 18   Ht 5\' 6"  (1.676 m)    Wt 58.1 kg   SpO2 100%   BMI 20.66 kg/m  Physical Exam Vitals and nursing note reviewed.  Constitutional:      General: He is not in acute distress.    Appearance: Normal appearance. He is not ill-appearing.  HENT:     Head: Normocephalic and atraumatic.     Mouth/Throat:     Mouth: Mucous membranes are moist.     Pharynx: Oropharynx is clear. No oropharyngeal exudate or posterior oropharyngeal erythema.  Eyes:     General: No scleral icterus.       Right eye: No discharge.        Left eye: No discharge.     Extraocular Movements: Extraocular movements intact.     Conjunctiva/sclera: Conjunctivae normal.  Cardiovascular:     Rate and Rhythm: Normal rate and regular rhythm.     Pulses: Normal pulses.     Heart sounds: Normal heart sounds. No murmur heard.    No friction rub. No gallop.  Pulmonary:     Effort: Pulmonary effort is normal. No respiratory distress.     Breath sounds: Normal breath sounds. No stridor. No wheezing, rhonchi or rales.  Abdominal:     General: Abdomen is flat. There is no distension.     Palpations: Abdomen is soft.     Tenderness: There is no abdominal tenderness. There is no guarding.  Musculoskeletal:     Cervical back: Normal  range of motion and neck supple. No rigidity.     Right lower leg: No edema.     Left lower leg: No edema.  Skin:    General: Skin is warm and dry.     Coloration: Skin is not pale.     Findings: No bruising or erythema.  Neurological:     General: No focal deficit present.     Mental Status: He is alert and oriented to person, place, and time. Mental status is at baseline.     Sensory: No sensory deficit.     Motor: No weakness.     Gait: Gait normal.  Psychiatric:        Mood and Affect: Mood normal.     ED Results / Procedures / Treatments   Labs (all labs ordered are listed, but only abnormal results are displayed) Labs Reviewed  RESP PANEL BY RT-PCR (RSV, FLU A&B, COVID)  [id number]     EKG None  Radiology No results found.  Procedures Procedures    Medications Ordered in ED Medications - No data to display  ED Course/ Medical Decision Making/ A&P                                 Medical Decision Making  Patient is a 33 year old male presents the ED today complaining of a 4-day history of dry cough, congestion, abdominal cramping, diarrhea.  States that symptoms have been improving over the last 4 days, improved further with Tylenol and Robitussin which she has been taking.  Tolerating p.o. intake without difficulty.    On physical exam, patient is noted to have normal heart rate, not hypoxic, no acute distress, speaking in full sentences, alert and oriented x 4.  LCTAB, RRR, no M/C/R/G.  Oropharynx clear without any signs of infection/erythema.  Uvula midline.  No cervical lymphadenopathy noted.  No meningeal signs.  Low suspicion for any underlying pneumonia or bacterial infection.  Respiratory panel pending at this time however does not change disposition.  Will have the patient continue to monitor symptoms at home.  Patient vital signs stable and patient is requesting to go home.  Will have him do his results online.  I believe this patient safe to discharge at this time.  Differential diagnoses prior to evaluation: URI, pneumonia, bronchitis, gastritis, enteritis, diverticulitis, pancreatitis, cholecystitis, pancreatitis, myocarditis.  Past Medical History / Social History / Additional history: Chart reviewed. Pertinent results include: Scoliosis  Medications / Treatment: No medications or treatments required for this visit.   Disposition: After consideration of the diagnostic results and the patients response to treatment, I feel that the patient benefit from discharge treatment as above.   emergency department workup does not suggest an emergent condition requiring admission or immediate intervention beyond what has been performed at this time. The  plan is: Symptomatic treatment at home, return for any worsening symptoms. The patient is safe for discharge and has been instructed to return immediately for worsening symptoms, change in symptoms or any other concerns.  Final Clinical Impression(s) / ED Diagnoses Final diagnoses:  Viral upper respiratory tract infection    Rx / DC Orders ED Discharge Orders     None         Lunette Stands, PA-C 02/24/24 1445    Coral Spikes, DO 02/24/24 [phone]

## 2024-02-24 NOTE — ED Triage Notes (Signed)
 Pt arrived reporting not feeling well and flu like symptoms for 3 days. Afebrile. No other symptoms.

## 2024-02-24 NOTE — Discharge Instructions (Signed)
 You were seen today for upper respiratory infection.  Low suspicion for any bacterial infection at this time.  Recommend that you continue to monitor symptoms and take Tylenol and ibuprofen as needed for fever and discomfort.  This is a viral illness that does not require any antibiotics at this time.   Take Tylenol (acetominophen)  650mg  every 4-6 hours, as needed for pain or fever. Do not take more than 4,000 mg in a 24-hour period. As this may cause liver damage. While this is rare, if you begin to develop yellowing of the skin or eyes, stop taking and return to ER immediately.  Take Ibuprofen 400mg  every 4-6 hours for pain or fever, not exceeding 3,200 mg per day as more than 3,200mg  can cause Stomach irritation, dizziness, kidney issues with long-term use.  Begin to have any new or worsening symptoms including fever not abated with ibuprofen and Tylenol, worsening shortness of breath, worsening chest pain, vision changes return to the ED for further evaluation.

## 2024-10-17 ENCOUNTER — Emergency Department (HOSPITAL_COMMUNITY)
Admission: EM | Admit: 2024-10-17 | Discharge: 2024-10-17 | Disposition: A | Discharge status: Home / Self Care | Payer: Self-pay | Attending: Emergency Medicine | Admitting: Emergency Medicine

## 2024-10-17 ENCOUNTER — Emergency Department (HOSPITAL_COMMUNITY): Payer: Self-pay

## 2024-10-17 ENCOUNTER — Other Ambulatory Visit: Payer: Self-pay

## 2024-10-17 DIAGNOSIS — S022XXA Fracture of nasal bones, initial encounter for closed fracture: Secondary | ICD-10-CM | POA: Insufficient documentation
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
